# Patient Record
Sex: Female | Born: 1995 | Race: White | Hispanic: No | Marital: Single | Smoking: Never smoker
Health system: Southern US, Community
[De-identification: ages and names within clinical notes are randomized; demographics above are authoritative.]

## PROBLEM LIST (undated history)

## (undated) DIAGNOSIS — IMO0001 Reserved for inherently not codable concepts without codable children: Secondary | ICD-10-CM

## (undated) DIAGNOSIS — K219 Gastro-esophageal reflux disease without esophagitis: Secondary | ICD-10-CM

## (undated) DIAGNOSIS — G44309 Post-traumatic headache, unspecified, not intractable: Secondary | ICD-10-CM

## (undated) DIAGNOSIS — R7303 Prediabetes: Secondary | ICD-10-CM

## (undated) DIAGNOSIS — Z9289 Personal history of other medical treatment: Secondary | ICD-10-CM

## (undated) DIAGNOSIS — S0990XS Unspecified injury of head, sequela: Secondary | ICD-10-CM

## (undated) HISTORY — DX: Personal history of other medical treatment: Z92.89

## (undated) HISTORY — DX: Prediabetes: R73.03

## (undated) HISTORY — PX: CHOLECYSTECTOMY: SHX55

## (undated) HISTORY — DX: Post-traumatic headache, unspecified, not intractable: G44.309

## (undated) HISTORY — DX: Gastro-esophageal reflux disease without esophagitis: K21.9

## (undated) HISTORY — DX: Reserved for inherently not codable concepts without codable children: IMO0001

## (undated) HISTORY — DX: Unspecified injury of head, sequela: S09.90XS

---
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
## 2015-06-06 ENCOUNTER — Encounter: Payer: Self-pay | Admitting: *Deleted

## 2015-06-06 ENCOUNTER — Emergency Department
Admission: EM | Admit: 2015-06-06 | Discharge: 2015-06-06 | Disposition: A | Discharge status: Home / Self Care | Payer: Medicaid Other | Attending: Emergency Medicine | Admitting: Emergency Medicine

## 2015-06-06 DIAGNOSIS — R110 Nausea: Secondary | ICD-10-CM | POA: Insufficient documentation

## 2015-06-06 DIAGNOSIS — Z792 Long term (current) use of antibiotics: Secondary | ICD-10-CM | POA: Diagnosis not present

## 2015-06-06 DIAGNOSIS — R51 Headache: Secondary | ICD-10-CM | POA: Diagnosis present

## 2015-06-06 DIAGNOSIS — R519 Headache, unspecified: Secondary | ICD-10-CM

## 2015-06-06 MED ORDER — BUTALBITAL-APAP-CAFFEINE 50-325-40 MG PO TABS: 1.0000 | ORAL_TABLET | Freq: Four times a day (QID) | ORAL | Status: AC | PRN

## 2015-06-06 MED ORDER — KETOROLAC TROMETHAMINE 30 MG/ML IJ SOLN
30.0000 mg | Freq: Once | INTRAMUSCULAR | Status: AC
  Administered 2015-06-06: 30 mg via INTRAVENOUS
  Filled 2015-06-06: qty 1

## 2015-06-06 MED ORDER — ONDANSETRON 8 MG PO TBDP
8.0000 mg | ORAL_TABLET | Freq: Once | ORAL | Status: AC
  Administered 2015-06-06: 8 mg via ORAL
  Filled 2015-06-06: qty 1

## 2015-06-06 MED ORDER — BUTALBITAL-APAP-CAFFEINE 50-325-40 MG PO TABS
1.0000 | ORAL_TABLET | Freq: Once | ORAL | Status: AC
  Administered 2015-06-06: 1 via ORAL
  Filled 2015-06-06: qty 1

## 2015-06-06 NOTE — ED Notes (Signed)
Pt has a headache.  Taking aleve without relief.  Pt alert and speech clear.  reports nausea earlier today.

## 2015-06-06 NOTE — ED Provider Notes (Signed)
[facility] Emergency Department Provider Note  ____________________________________________  Time seen: Approximately 9:37 PM  I have reviewed the triage vital signs and the nursing notes.   HISTORY  Chief Complaint Headache    HPI Vanessa Booker is a 19 y.o. female presents for evaluation of headache. Patient states the headache started earlier today with nausea. Past medical history significant for migraines. No relief with over-the-counter Aleve today.   Past Medical History  Diagnosis Date  . Asthma   . History of PFTs sept 2013    normal   . Reflux   . Prediabetes   . Headaches due to old head injury     Patient Active Problem List   Diagnosis Date Noted  . Adjustment disorder with mixed anxiety and depressed mood 09/13/2013  . Folliculitis 07/27/2013  . Hidradenitis suppurativa 10/01/2012  . Prediabetes 09/21/2012    No past surgical history on file.  Current Outpatient Rx  Name  Route  Sig  Dispense  Refill  . albuterol (PROVENTIL HFA;VENTOLIN HFA) 108 (90 BASE) MCG/ACT inhaler   Inhalation   Inhale 2 puffs into the lungs every 6 (six) hours as needed. Shortness of Breath   1 Inhaler   5   . butalbital-acetaminophen-caffeine (FIORICET) 50-325-40 MG tablet   Oral   Take 1-2 tablets by mouth every 6 (six) hours as needed for headache.   20 tablet   0   . chlorzoxazone (PARAFON) 500 MG tablet   Oral   Take 1 tablet (500 mg total) by mouth 2 (two) times daily as needed for muscle spasms.   28 tablet   0   . clindamycin (CLEOCIN) 300 MG capsule   Oral   Take 1 capsule (300 mg total) by mouth 3 (three) times daily.   21 capsule   0   . ibuprofen (ADVIL,MOTRIN) 600 MG tablet   Oral   Take 1 tablet (600 mg total) by mouth every 6 (six) hours as needed.   90 tablet   1     Allergies Metformin and related; Cefazolin; and Cefzil  Family History  Problem Relation Age of Onset  . Diabetes Mother   . Hypertension Mother    . Depression Mother   . Kidney disease Mother   . Anxiety disorder Mother   . Cancer Father     passed away from colon cancer  . Cancer Maternal Aunt   . Anxiety disorder Maternal Aunt   . Depression Maternal Aunt   . Diabetes Maternal Grandfather   . Hypertension Maternal Grandfather   . Anxiety disorder Maternal Grandfather   . Depression Maternal Grandfather     Social History Social History  Substance Use Topics  . Smoking status: Never Smoker   . Smokeless tobacco: Never Used  . Alcohol Use: No    Review of Systems Constitutional: No fever/chills Eyes: No visual changes. ENT: No sore throat.  Cardiovascular: Denies chest pain. Respiratory: Denies shortness of breath. Gastrointestinal: No abdominal pain.  Positive nausea, no vomiting.  No diarrhea.  No constipation. Genitourinary: Negative for dysuria. Musculoskeletal: Negative for back pain. Skin: Negative for rash. Neurological: Positive for headaches, negative for focal weakness or numbness.  10-point ROS otherwise negative.  ____________________________________________   PHYSICAL EXAM:  VITAL SIGNS: ED Triage Vitals  Enc Vitals Group     BP 06/06/15 2103 133/79 mmHg     Pulse Rate 06/06/15 2103 74     Resp --      Temp 06/06/15 2103 97.5  F (36.4 C)     Temp Source 06/06/15 2103 Oral     SpO2 06/06/15 2103 97 %     Weight 06/06/15 2103 264 lb (119.75 kg)     Height 06/06/15 2103 5\' 5"  (1.651 m)     Head Cir --      Peak Flow --      Pain Score 06/06/15 2103 5     Pain Loc --      Pain Edu? --      Excl. in GC? --     Constitutional: Alert and oriented. Well appearing and in no acute distress. Eyes: Conjunctivae are normal. PERRL. EOMI. Head: Atraumatic. Nose: No congestion/rhinnorhea. Mouth/Throat: Mucous membranes are moist.  Oropharynx non-erythematous. Neck: No stridor.   Cardiovascular: Normal rate, regular rhythm. Grossly normal heart sounds.  Good peripheral  circulation. Respiratory: Normal respiratory effort.  No retractions. Lungs CTAB. Musculoskeletal: No lower extremity tenderness nor edema.  No joint effusions. Neurologic:  Normal speech and language. No gross focal neurologic deficits are appreciated. No gait instability. Skin:  Skin is warm, dry and intact. No rash noted. Psychiatric: Mood and affect are normal. Speech and behavior are normal.  ____________________________________________   LABS (all labs ordered are listed, but only abnormal results are displayed)  Labs Reviewed - No data to display ____________________________________________   PROCEDURES  Procedure(s) performed: None  Critical Care performed: No  ____________________________________________   INITIAL IMPRESSION / ASSESSMENT AND PLAN / ED COURSE  Pertinent labs & imaging results that were available during my care of the patient were reviewed by me and considered in my medical decision making (see chart for details).  Acute tension/migraine headache. Patient reports much improvement after IV Toradol. Zofran 8 mg sublingual given. She discharged with Rx for Fioricet. Patient follow-up with PCP or return to the ER with any worsening symptomology. ____________________________________________   FINAL CLINICAL IMPRESSION(S) / ED DIAGNOSES  Final diagnoses:  Headache around the eyes      Evangeline Dakinharles M Raiden Yearwood, PA-C 06/06/15 2302  Rockne MenghiniAnne-Caroline Norman, MD 06/06/15 2353

## 2015-06-06 NOTE — Discharge Instructions (Signed)
Migraine Headache  A migraine headache is very bad, throbbing pain on one or both sides of your head. Talk to your doctor about what things may bring on (trigger) your migraine headaches.  HOME CARE  · Only take medicines as told by your doctor.  · Lie down in a dark, quiet room when you have a migraine.  · Keep a journal to find out if certain things bring on migraine headaches. For example, write down:    What you eat and drink.    How much sleep you get.    Any change to your diet or medicines.  · Lessen how much alcohol you drink.  · Quit smoking if you smoke.  · Get enough sleep.  · Lessen any stress in your life.  · Keep lights dim if bright lights bother you or make your migraines worse.  GET HELP RIGHT AWAY IF:   · Your migraine becomes really bad.  · You have a fever.  · You have a stiff neck.  · You have trouble seeing.  · Your muscles are weak, or you lose muscle control.  · You lose your balance or have trouble walking.  · You feel like you will pass out (faint), or you pass out.  · You have really bad symptoms that are different than your first symptoms.  MAKE SURE YOU:   · Understand these instructions.  · Will watch your condition.  · Will get help right away if you are not doing well or get worse.     This information is not intended to replace advice given to you by your health care provider. Make sure you discuss any questions you have with your health care provider.     Document Released: 03/26/2008 Document Revised: 09/09/2011 Document Reviewed: 02/22/2013  Elsevier Interactive Patient Education ©2016 Else[employer] Inc[employer]

## 2015-07-27 ENCOUNTER — Emergency Department
Admission: EM | Admit: 2015-07-27 | Discharge: 2015-07-28 | Disposition: A | Discharge status: Home / Self Care | Payer: PRIVATE HEALTH INSURANCE | Attending: Emergency Medicine | Admitting: Emergency Medicine

## 2015-07-27 ENCOUNTER — Encounter: Payer: Self-pay | Admitting: Urgent Care

## 2015-07-27 DIAGNOSIS — R1011 Right upper quadrant pain: Secondary | ICD-10-CM | POA: Insufficient documentation

## 2015-07-27 DIAGNOSIS — Z792 Long term (current) use of antibiotics: Secondary | ICD-10-CM | POA: Insufficient documentation

## 2015-07-27 DIAGNOSIS — R1013 Epigastric pain: Secondary | ICD-10-CM | POA: Diagnosis present

## 2015-07-27 DIAGNOSIS — Z3202 Encounter for pregnancy test, result negative: Secondary | ICD-10-CM | POA: Insufficient documentation

## 2015-07-27 DIAGNOSIS — R110 Nausea: Secondary | ICD-10-CM | POA: Diagnosis not present

## 2015-07-27 DIAGNOSIS — N939 Abnormal uterine and vaginal bleeding, unspecified: Secondary | ICD-10-CM | POA: Insufficient documentation

## 2015-07-27 LAB — COMPREHENSIVE METABOLIC PANEL
ALT: 36 U/L (ref 14–54)
AST: 26 U/L (ref 15–41)
Albumin: 4 g/dL (ref 3.5–5.0)
Alkaline Phosphatase: 64 U/L (ref 38–126)
Anion gap: 9 (ref 5–15)
BUN: 13 mg/dL (ref 6–20)
CO2: 26 mmol/L (ref 22–32)
CREATININE: 0.6 mg/dL (ref 0.44–1.00)
Calcium: 9 mg/dL (ref 8.9–10.3)
Chloride: 102 mmol/L (ref 101–111)
GFR calc Af Amer: >60 mL/min (ref >60)
GFR calc non Af Amer: >60 mL/min (ref >60)
Glucose, Bld: 86 mg/dL (ref 65–99)
Potassium: 3.5 mmol/L (ref 3.5–5.1)
SODIUM: 137 mmol/L (ref 135–145)
Total Bilirubin: 1.1 mg/dL (ref 0.3–1.2)
Total Protein: 7.9 g/dL (ref 6.5–8.1)

## 2015-07-27 LAB — URINALYSIS COMPLETE WITH MICROSCOPIC (ARMC ONLY)
BACTERIA UA: NONE SEEN
Bilirubin Urine: NEGATIVE
Glucose, UA: NEGATIVE mg/dL
Ketones, ur: NEGATIVE mg/dL
LEUKOCYTES UA: NEGATIVE
Nitrite: NEGATIVE
PH: 5 (ref 5.0–8.0)
PROTEIN: NEGATIVE mg/dL
Specific Gravity, Urine: 1.021 (ref 1.005–1.030)

## 2015-07-27 LAB — CBC
HCT: 40.7 % (ref 35.0–47.0)
Hemoglobin: 13.9 g/dL (ref 12.0–16.0)
MCH: 29 pg (ref 26.0–34.0)
MCHC: 34.1 g/dL (ref 32.0–36.0)
MCV: 85 fL (ref 80.0–100.0)
PLATELETS: 231 10*3/uL (ref 150–440)
RBC: 4.78 MIL/uL (ref 3.80–5.20)
RDW: 13 % (ref 11.5–14.5)
WBC: 5.1 10*3/uL (ref 3.6–11.0)

## 2015-07-27 LAB — POCT PREGNANCY, URINE: PREG TEST UR: NEGATIVE

## 2015-07-27 LAB — TROPONIN I

## 2015-07-27 LAB — LIPASE, BLOOD: Lipase: 31 U/L (ref 11–51)

## 2015-07-27 NOTE — ED Notes (Addendum)
Patient presents with c/o epigastric pain since yesterday. (+) nausea. Pain is through and through to back and worse after eating. Also reports that she has not has a peroiod in over a year until 2 days ago. States, "I am not passing red blood. I am passing black blood."

## 2015-07-28 ENCOUNTER — Emergency Department: Payer: PRIVATE HEALTH INSURANCE

## 2015-07-28 DIAGNOSIS — R1013 Epigastric pain: Secondary | ICD-10-CM | POA: Diagnosis not present

## 2015-07-28 MED ORDER — SUCRALFATE 1 G PO TABS: 1.0000 g | ORAL_TABLET | Freq: Four times a day (QID) | ORAL | Status: DC | PRN

## 2015-07-28 NOTE — ED Provider Notes (Signed)
[facility] Emergency Department Provider Note  ____________________________________________  Time seen: Approximately 12:13 AM  I have reviewed the triage vital signs and the nursing notes.   HISTORY  Chief Complaint Abdominal Pain    HPI Vanessa Booker is a 20 y.o. female with no significant PMHthen acid reflux and mild asthma who presents with approximately 2 days of constant epigastric and right upper quadrant pain.  She states that she thinks it started after she ate and has been constant since then, waxing and waning in severity from mild to severe.  It feels like a sharp and stabbing sensation that is radiating through to her back.  It is accompanied with nausea but no vomiting and no diarrhea.  There is no lower abdominal pain.  She denies fever/chills, chest pain, shortness of breath, dysuria.  Nothing makes it better and eating makes it worse.  She also reports that she is started "passing black blood" from her vagina over the last couple of days which is unusual for her because she has not had a period of more than a year.  However, she has no pelvic or vaginal pain and tells me she is not particular concerned about this.   Past Medical History  Diagnosis Date  . Asthma   . History of PFTs sept 2013    normal   . Reflux   . Prediabetes   . Headaches due to old head injury     Patient Active Problem List   Diagnosis Date Noted  . Adjustment disorder with mixed anxiety and depressed mood 09/13/2013  . Folliculitis 07/27/2013  . Hidradenitis suppurativa 10/01/2012  . Prediabetes 09/21/2012    History reviewed. No pertinent past surgical history.  Current Outpatient Rx  Name  Route  Sig  Dispense  Refill  . albuterol (PROVENTIL HFA;VENTOLIN HFA) 108 (90 BASE) MCG/ACT inhaler   Inhalation   Inhale 2 puffs into the lungs every 6 (six) hours as needed. Shortness of Breath   1 Inhaler   5   . butalbital-acetaminophen-caffeine (FIORICET)  50-325-40 MG tablet   Oral   Take 1-2 tablets by mouth every 6 (six) hours as needed for headache.   20 tablet   0   . chlorzoxazone (PARAFON) 500 MG tablet   Oral   Take 1 tablet (500 mg total) by mouth 2 (two) times daily as needed for muscle spasms.   28 tablet   0   . clindamycin (CLEOCIN) 300 MG capsule   Oral   Take 1 capsule (300 mg total) by mouth 3 (three) times daily.   21 capsule   0   . ibuprofen (ADVIL,MOTRIN) 600 MG tablet   Oral   Take 1 tablet (600 mg total) by mouth every 6 (six) hours as needed.   90 tablet   1   . sucralfate (CARAFATE) 1 g tablet   Oral   Take 1 tablet (1 g total) by mouth 4 (four) times daily as needed (for abdominal discomfort, nausea, and/or vomiting).   30 tablet   1     Allergies Metformin and related; Cefazolin; and Cefzil  Family History  Problem Relation Age of Onset  . Diabetes Mother   . Hypertension Mother   . Depression Mother   . Kidney disease Mother   . Anxiety disorder Mother   . Cancer Father     passed away from colon cancer  . Cancer Maternal Aunt   . Anxiety disorder Maternal Aunt   . Depression  Maternal Aunt   . Diabetes Maternal Grandfather   . Hypertension Maternal Grandfather   . Anxiety disorder Maternal Grandfather   . Depression Maternal Grandfather     Social History Social History  Substance Use Topics  . Smoking status: Never Smoker   . Smokeless tobacco: Never Used  . Alcohol Use: No    Review of Systems Constitutional: No fever/chills Eyes: No visual changes. ENT: No sore throat. Cardiovascular: Denies chest pain. Respiratory: Denies shortness of breath. Gastrointestinal: Epigastric and right upper quadrant pain described as sharp and stabbing accompanied with nausea Genitourinary: Negative for dysuria. Musculoskeletal: Negative for back pain. Skin: Negative for rash. Neurological: Negative for headaches, focal weakness or numbness.  10-point ROS otherwise  negative.  ____________________________________________   PHYSICAL EXAM:  VITAL SIGNS: ED Triage Vitals  Enc Vitals Group     BP 07/27/15 2112 119/76 mmHg     Pulse Rate 07/27/15 2112 92     Resp 07/27/15 2112 16     Temp 07/27/15 2112 98.6 F (37 C)     Temp Source 07/27/15 2112 Oral     SpO2 07/27/15 2112 99 %     Weight 07/27/15 2112 265 lb (120.203 kg)     Height 07/27/15 2112  (1.676 m)     Head Cir --      Peak Flow --      Pain Score 07/27/15 2113 6     Pain Loc --      Pain Edu? --      Excl. in GC? --     Constitutional: Alert and oriented. Well appearing and in no acute distress. Eyes: Conjunctivae are normal. PERRL. EOMI. Head: Atraumatic. Nose: No congestion/rhinnorhea. Mouth/Throat: Mucous membranes are moist.  Oropharynx non-erythematous. Neck: No stridor.   Cardiovascular: Normal rate, regular rhythm. Grossly normal heart sounds.  Good peripheral circulation. Respiratory: Normal respiratory effort.  No retractions. Lungs CTAB. Gastrointestinal: Tender to palpation of the epigastrium and right upper quadrant with a mild positive Murphy's sign.  No lower abdominal tenderness including no tenderness at McBurney's point.  Soft, no rebound or guarding. Genitourinary: Deferred at patient's request Musculoskeletal: No lower extremity tenderness nor edema.  No joint effusions. Neurologic:  Normal speech and language. No gross focal neurologic deficits are appreciated.  Skin:  Skin is warm, dry and intact. No rash noted. Psychiatric: Mood and affect are normal. Speech and behavior are normal.  ____________________________________________   LABS (all labs ordered are listed, but only abnormal results are displayed)  Labs Reviewed  URINALYSIS COMPLETEWITH MICROSCOPIC ([facility] ONLY) - Abnormal; Notable for the following:    Color, Urine YELLOW (*)    APPearance CLEAR (*)    Hgb urine dipstick 2+ (*)    Squamous Epithelial / LPF 0-5 (*)    All other  components within normal limits  LIPASE, BLOOD  COMPREHENSIVE METABOLIC PANEL  CBC  TROPONIN I  POC URINE PREG, ED  POCT PREGNANCY, URINE   ____________________________________________  EKG  None ____________________________________________  RADIOLOGY   US Abdomen Limited Ruq  07/28/2015  CLINICAL DATA:  Acute onset of right upper quadrant and epigastric abdominal pain. Initial encounter. EXAM: US ABDOMEN LIMITED - RIGHT UPPER QUADRANT COMPARISON:  Abdominal ultrasound performed 04/26/2008 FINDINGS: Gallbladder: No gallstones or wall thickening visualized. No sonographic Murphy sign noted by sonographer. Common bile duct: Diameter: 0.3 cm, within normal limits in caliber. Liver: No focal lesion identified. Diffusely increased parenchymal echogenicity and coarsened echotexture, compatible with fatty infiltration. IMPRESSION: 1. No acute  abnormality seen at the right upper quadrant. 2. Diffuse fatty infiltration within the liver. Electronically Signed   By: Roanna Raider M.D.   On: 07/28/2015 02:05    ____________________________________________   PROCEDURES  Procedure(s) performed: None  Critical Care performed: No ____________________________________________   INITIAL IMPRESSION / ASSESSMENT AND PLAN / ED COURSE  Pertinent labs & imaging results that were available during my care of the patient were reviewed by me and considered in my medical decision making (see chart for details).  The patient's symptoms are mild at this time and she declines any pain medicine or antiemetics.  We discussed the report of abnormal vaginal bleeding and she declines a pelvic exam at this time and would rather follow up with her primary care doctor which I believe is appropriate.  Her labs are unremarkable and her vital signs are normal.  I am obtaining a right upper quadrant ultrasound to evaluate for possible biliary colic.  ----------------------------------------- 2:22 AM on  07/28/2015 -----------------------------------------  Ultrasound unremarkable.  Patient's symptoms most likely the result of acid reflux/gastritis although she has had no vomiting.  She is asymptomatic now and was sleeping comfortably when I went back in the room to give her an update.  I gave my usual and customary return precautions.    ____________________________________________  FINAL CLINICAL IMPRESSION(S) / ED DIAGNOSES  Final diagnoses:  Epigastric pain      NEW MEDICATIONS STARTED DURING THIS VISIT:  New Prescriptions   SUCRALFATE (CARAFATE) 1 G TABLET    Take 1 tablet (1 g total) by mouth 4 (four) times daily as needed (for abdominal discomfort, nausea, and/or vomiting).     Loleta Rose, MD 07/28/15 [phone]

## 2015-07-28 NOTE — ED Notes (Signed)
MD at bedside. 

## 2015-07-28 NOTE — Discharge Instructions (Signed)
You have been seen in the Emergency Department (ED) for abdominal pain.  Your evaluation did not identify a clear cause of your symptoms but was generally reassuring. ° °Please follow up as instructed above regarding today’s emergent visit and the symptoms that are bothering you. ° °Return to the ED if your abdominal pain worsens or fails to improve, you develop bloody vomiting, bloody diarrhea, you are unable to tolerate fluids due to vomiting, fever greater than 101, or other symptoms that concern you. ° ° °Abdominal Pain, Adult °Many things can cause abdominal pain. Usually, abdominal pain is not caused by a disease and will improve without treatment. It can often be observed and treated at home. Your health care provider will do a physical exam and possibly order blood tests and X-rays to help determine the seriousness of your pain. However, in many cases, more time must pass before a clear cause of the pain can be found. Before that point, your health care provider may not know if you need more testing or further treatment. °HOME CARE INSTRUCTIONS °Monitor your abdominal pain for any changes. The following actions may help to alleviate any discomfort you are experiencing: °· Only take over-the-counter or prescription medicines as directed by your health care provider. °· Do not take laxatives unless directed to do so by your health care provider. °· Try a clear liquid diet (broth, tea, or water) as directed by your health care provider. Slowly move to a bland diet as tolerated. °SEEK MEDICAL CARE IF: °· You have unexplained abdominal pain. °· You have abdominal pain associated with nausea or diarrhea. °· You have pain when you urinate or have a bowel movement. °· You experience abdominal pain that wakes you in the night. °· You have abdominal pain that is worsened or improved by eating food. °· You have abdominal pain that is worsened with eating fatty foods. °· You have a fever. °SEEK IMMEDIATE MEDICAL CARE  IF: °· Your pain does not go away within 2 hours. °· You keep throwing up (vomiting). °· Your pain is felt only in portions of the abdomen, such as the right side or the left lower portion of the abdomen. °· You pass bloody or black tarry stools. °MAKE SURE YOU: °· Understand these instructions. °· Will watch your condition. °· Will get help right away if you are not doing well or get worse. °  °This information is not intended to replace advice given to you by your health care provider. Make sure you discuss any questions you have with your health care provider. °  °Document Released: 03/27/2005 Document Revised: 03/08/2015 Document Reviewed: 02/24/2013 °Elsevier Interactive Patient Education ©2016 [employer]vier Inc. ° °

## 2015-07-28 NOTE — ED Notes (Signed)
Pt alert and oriented X4, active, cooperative, pt in NAD. RR even and unlabored, color WNL.  Pt informed to return if any life threatening symptoms occur.   

## 2015-09-21 ENCOUNTER — Encounter: Payer: Self-pay | Admitting: Emergency Medicine

## 2015-09-21 ENCOUNTER — Emergency Department
Admission: EM | Admit: 2015-09-21 | Discharge: 2015-09-21 | Disposition: A | Discharge status: Home / Self Care | Payer: PRIVATE HEALTH INSURANCE | Attending: Emergency Medicine | Admitting: Emergency Medicine

## 2015-09-21 DIAGNOSIS — R05 Cough: Secondary | ICD-10-CM | POA: Diagnosis present

## 2015-09-21 DIAGNOSIS — J45909 Unspecified asthma, uncomplicated: Secondary | ICD-10-CM | POA: Insufficient documentation

## 2015-09-21 DIAGNOSIS — F4323 Adjustment disorder with mixed anxiety and depressed mood: Secondary | ICD-10-CM | POA: Diagnosis not present

## 2015-09-21 DIAGNOSIS — J40 Bronchitis, not specified as acute or chronic: Secondary | ICD-10-CM

## 2015-09-21 DIAGNOSIS — J209 Acute bronchitis, unspecified: Secondary | ICD-10-CM | POA: Insufficient documentation

## 2015-09-21 LAB — RAPID INFLUENZA A&B ANTIGENS: Influenza B (ARMC): NEGATIVE

## 2015-09-21 LAB — RAPID INFLUENZA A&B ANTIGENS (ARMC ONLY): INFLUENZA A (ARMC): NEGATIVE

## 2015-09-21 MED ORDER — PREDNISONE 10 MG PO TABS: 10.0000 mg | ORAL_TABLET | ORAL | Status: DC

## 2015-09-21 MED ORDER — AZITHROMYCIN 250 MG PO TABS: ORAL_TABLET | ORAL | Status: DC

## 2015-09-21 MED ORDER — PSEUDOEPH-BROMPHEN-DM 30-2-10 MG/5ML PO SYRP: 10.0000 mL | ORAL_SOLUTION | Freq: Four times a day (QID) | ORAL | Status: DC | PRN

## 2015-09-21 MED ORDER — ALBUTEROL SULFATE HFA 108 (90 BASE) MCG/ACT IN AERS: 2.0000 | INHALATION_SPRAY | RESPIRATORY_TRACT | Status: DC | PRN

## 2015-09-21 MED ORDER — IBUPROFEN 800 MG PO TABS
ORAL_TABLET | ORAL | Status: AC
  Filled 2015-09-21: qty 1

## 2015-09-21 MED ORDER — IBUPROFEN 800 MG PO TABS
800.0000 mg | ORAL_TABLET | Freq: Once | ORAL | Status: AC
  Administered 2015-09-21: 800 mg via ORAL

## 2015-09-21 NOTE — ED Notes (Signed)
Pt discharged to home.  Friend driving.  Discharge instructions reviewed.  Verbalized understanding.  No questions or concerns at this time.  Teach back verified.  Pt in NAD.  No items left in ED.   

## 2015-09-21 NOTE — Discharge Instructions (Signed)

## 2015-09-21 NOTE — ED Notes (Signed)
States productive cough for green phlem, headache, chills x 1 day.  Also c/o fever 101.2 this evening.  Has not medicated for fever.

## 2015-09-21 NOTE — ED Provider Notes (Signed)
[facility] Emergency Department Provider Note  ____________________________________________  Time seen: Approximately 9:42 PM  I have reviewed the triage vital signs and the nursing notes.   HISTORY  Chief Complaint Fever and Cough    HPI Vanessa Booker is a 20 y.o. female who presents emergency department for several day history of cough. Patient states that today she also developed a fever of 101.76F. Patient states that she is having some audible wheezing. She endorses some chest tightness with coughing. She states the cough is productive in nature. No difficulty breathing. She does have an intermittent headache. She denies any vision changes, neck pain, chest pain, shortness of breath, abdominal pain, nausea or vomiting.   Past Medical History  Diagnosis Date  . Asthma   . History of PFTs sept 2013    normal   . Reflux   . Prediabetes   . Headaches due to old head injury     Patient Active Problem List   Diagnosis Date Noted  . Adjustment disorder with mixed anxiety and depressed mood 09/13/2013  . Folliculitis 07/27/2013  . Hidradenitis suppurativa 10/01/2012  . Prediabetes 09/21/2012    History reviewed. No pertinent past surgical history.  Current Outpatient Rx  Name  Route  Sig  Dispense  Refill  . albuterol (PROVENTIL HFA;VENTOLIN HFA) 108 (90 Base) MCG/ACT inhaler   Inhalation   Inhale 2 puffs into the lungs every 4 (four) hours as needed for wheezing or shortness of breath.   1 Inhaler   0   . azithromycin (ZITHROMAX Z-PAK) 250 MG tablet      Take 2 tablets (500 mg) on  Day 1,  followed by 1 tablet (250 mg) once daily on Days 2 through 5.   6 each   0   . brompheniramine-pseudoephedrine-DM 30-2-10 MG/5ML syrup   Oral   Take 10 mLs by mouth 4 (four) times daily as needed.   200 mL   0   . butalbital-acetaminophen-caffeine (FIORICET) 50-325-40 MG tablet   Oral   Take 1-2 tablets by mouth every 6 (six) hours as needed for  headache.   20 tablet   0   . chlorzoxazone (PARAFON) 500 MG tablet   Oral   Take 1 tablet (500 mg total) by mouth 2 (two) times daily as needed for muscle spasms.   28 tablet   0   . clindamycin (CLEOCIN) 300 MG capsule   Oral   Take 1 capsule (300 mg total) by mouth 3 (three) times daily.   21 capsule   0   . ibuprofen (ADVIL,MOTRIN) 600 MG tablet   Oral   Take 1 tablet (600 mg total) by mouth every 6 (six) hours as needed.   90 tablet   1   . predniSONE (DELTASONE) 10 MG tablet   Oral   Take 1 tablet (10 mg total) by mouth as directed.   21 tablet   0     Take on a daily basis of 6, 5, 4, 3, 2, 1   . sucralfate (CARAFATE) 1 g tablet   Oral   Take 1 tablet (1 g total) by mouth 4 (four) times daily as needed (for abdominal discomfort, nausea, and/or vomiting).   30 tablet   1     Allergies Metformin and related; Cefazolin; and Cefzil  Family History  Problem Relation Age of Onset  . Diabetes Mother   . Hypertension Mother   . Depression Mother   . Kidney disease Mother   .  Anxiety disorder Mother   . Cancer Father     passed away from colon cancer  . Cancer Maternal Aunt   . Anxiety disorder Maternal Aunt   . Depression Maternal Aunt   . Diabetes Maternal Grandfather   . Hypertension Maternal Grandfather   . Anxiety disorder Maternal Grandfather   . Depression Maternal Grandfather     Social History Social History  Substance Use Topics  . Smoking status: Never Smoker   . Smokeless tobacco: Never Used  . Alcohol Use: No     Review of Systems  Constitutional: Positive fever/chills ENT: No sore throat. No nasal congestion. Cardiovascular: no chest pain. Respiratory: Positive for productive cough. No SOB. Skin: Negative for rash. Neurological: Negative for headaches, focal weakness or numbness. 10-point ROS otherwise negative.  ____________________________________________   PHYSICAL EXAM:  VITAL SIGNS: ED Triage Vitals  Enc Vitals Group      BP 09/21/15 1954 111/66 mmHg     Pulse Rate 09/21/15 1954 117     Resp 09/21/15 1954 18     Temp 09/21/15 1954 99.1 F (37.3 C)     Temp Source 09/21/15 1954 Oral     SpO2 09/21/15 1954 96 %     Weight 09/21/15 1954 264 lb (119.75 kg)     Height 09/21/15 1954  (1.676 m)     Head Cir --      Peak Flow --      Pain Score 09/21/15 1955 7     Pain Loc --      Pain Edu? --      Excl. in GC? --      Constitutional: Alert and oriented. Well appearing and in no acute distress. Eyes: Conjunctivae are normal. PERRL. EOMI. Head: Atraumatic. ENT:      Ears: EACs and TMs are unremarkable bilaterally.      Nose: No congestion/rhinnorhea.      Mouth/Throat: Mucous membranes are moist.  Neck: No stridor.   Hematological/Lymphatic/Immunilogical: No cervical lymphadenopathy. Cardiovascular: Normal rate, regular rhythm. Normal S1 and S2.  Good peripheral circulation. Respiratory: Normal respiratory effort without tachypnea or retractions. Lungs with scattered expiratory wheezing. No rales or rhonchi. Good air entry into the bases. Neurologic:  Normal speech and language. No gross focal neurologic deficits are appreciated.  Skin:  Skin is warm, dry and intact. No rash noted. Psychiatric: Mood and affect are normal. Speech and behavior are normal. Patient exhibits appropriate insight and judgement.   ____________________________________________   LABS (all labs ordered are listed, but only abnormal results are displayed)  Labs Reviewed  RAPID INFLUENZA A&B ANTIGENS ([facility] ONLY)   ____________________________________________  EKG   ____________________________________________  RADIOLOGY   No results found.  ____________________________________________    PROCEDURES  Procedure(s) performed:       Medications  ibuprofen (ADVIL,MOTRIN) 800 MG tablet (not administered)  ibuprofen (ADVIL,MOTRIN) tablet 800 mg (800 mg Oral Given 09/21/15 2138)      ____________________________________________   INITIAL IMPRESSION / ASSESSMENT AND PLAN / ED COURSE  Pertinent labs & imaging results that were available during my care of the patient were reviewed by me and considered in my medical decision making (see chart for details).  Patient's diagnosis is consistent with acute bronchitis. Patient's exam is reassuring. She was negative for the fluid per the emergency department.. Patient will be discharged home with prescriptions for albuterol, prednisone, azithromycin, and Bromfed cough syrup. Patient is to follow up with primary care provider if symptoms persist past this treatment course. Patient  is given ED precautions to return to the ED for any worsening or new symptoms.     ____________________________________________  FINAL CLINICAL IMPRESSION(S) / ED DIAGNOSES  Final diagnoses:  Bronchitis      NEW MEDICATIONS STARTED DURING THIS VISIT:  New Prescriptions   ALBUTEROL (PROVENTIL HFA;VENTOLIN HFA) 108 (90 BASE) MCG/ACT INHALER    Inhale 2 puffs into the lungs every 4 (four) hours as needed for wheezing or shortness of breath.   AZITHROMYCIN (ZITHROMAX Z-PAK) 250 MG TABLET    Take 2 tablets (500 mg) on  Day 1,  followed by 1 tablet (250 mg) once daily on Days 2 through 5.   BROMPHENIRAMINE-PSEUDOEPHEDRINE-DM 30-2-10 MG/5ML SYRUP    Take 10 mLs by mouth 4 (four) times daily as needed.   PREDNISONE (DELTASONE) 10 MG TABLET    Take 1 tablet (10 mg total) by mouth as directed.        This chart was dictated using voice recognition software/Dragon. Despite best efforts to proofread, errors can occur which can change the meaning. Any change was purely unintentional.    Racheal Patches, PA-C 09/21/15 2153  Jene Every, MD 09/21/15 2245

## 2015-11-01 ENCOUNTER — Encounter: Payer: Self-pay | Admitting: Family Medicine

## 2015-11-01 ENCOUNTER — Ambulatory Visit (INDEPENDENT_AMBULATORY_CARE_PROVIDER_SITE_OTHER): Payer: PRIVATE HEALTH INSURANCE | Admitting: Family Medicine

## 2015-11-01 VITALS — BP 110/70 | Ht 65.0 in | Wt 279.1 lb

## 2015-11-01 DIAGNOSIS — G5603 Carpal tunnel syndrome, bilateral upper limbs: Secondary | ICD-10-CM

## 2015-11-01 NOTE — Addendum Note (Signed)
Addended by: Margaretha SheffieldBROWN, Shree Espey S on: 11/01/2015 01:43 PM   Modules accepted: Orders

## 2015-11-01 NOTE — Progress Notes (Signed)
   Subjective:    Patient ID: Vanessa Booker, female    DOB: 12/13/1995, 20 y.o.   MRN: [id number]  Wrist Pain  The pain is present in the left hand and right hand. This is a new problem. The current episode started more than 1 month ago. The problem occurs intermittently. The problem has been unchanged. The quality of the pain is described as aching. The pain is moderate. Associated symptoms include numbness and tingling. The symptoms are aggravated by activity. She has tried NSAIDS for the symptoms. The treatment provided no relief.   Patient has no other concerns at this time.  Patient relates that goes asleep on her in a frequent basis when she is doing activities or even at nighttime she'll wake up in her hands or go to sleep on her.  Review of Systems  Neurological: Positive for tingling and numbness.       Objective:   Physical Exam Positive Tinel and Phalen's No weakness in the hands noted forearms normal lungs clear heart regular       Assessment & Plan:  Bilateral carpal tunnel syndrome recommend nerve conduction studies await the results of this. In addition to this patient will need orthopedic referral but first nerve conduction study she is to wear braces at nighttime

## 2015-11-01 NOTE — Progress Notes (Signed)
Referral ordered in EPIC. 

## 2015-11-02 ENCOUNTER — Encounter: Payer: Self-pay | Admitting: Family Medicine

## 2015-11-29 ENCOUNTER — Emergency Department: Payer: PRIVATE HEALTH INSURANCE

## 2015-11-29 ENCOUNTER — Encounter: Payer: Self-pay | Admitting: Medical Oncology

## 2015-11-29 ENCOUNTER — Emergency Department
Admission: EM | Admit: 2015-11-29 | Discharge: 2015-11-29 | Disposition: A | Discharge status: Home / Self Care | Payer: PRIVATE HEALTH INSURANCE | Attending: Emergency Medicine | Admitting: Emergency Medicine

## 2015-11-29 DIAGNOSIS — R1031 Right lower quadrant pain: Secondary | ICD-10-CM | POA: Diagnosis present

## 2015-11-29 DIAGNOSIS — N939 Abnormal uterine and vaginal bleeding, unspecified: Secondary | ICD-10-CM | POA: Diagnosis not present

## 2015-11-29 DIAGNOSIS — J45909 Unspecified asthma, uncomplicated: Secondary | ICD-10-CM | POA: Diagnosis not present

## 2015-11-29 DIAGNOSIS — R1030 Lower abdominal pain, unspecified: Secondary | ICD-10-CM

## 2015-11-29 LAB — COMPREHENSIVE METABOLIC PANEL
ALT: 22 U/L (ref 14–54)
AST: 20 U/L (ref 15–41)
Albumin: 3.9 g/dL (ref 3.5–5.0)
Alkaline Phosphatase: 49 U/L (ref 38–126)
Anion gap: 7 (ref 5–15)
BUN: 15 mg/dL (ref 6–20)
CHLORIDE: 106 mmol/L (ref 101–111)
CO2: 25 mmol/L (ref 22–32)
CREATININE: 0.54 mg/dL (ref 0.44–1.00)
Calcium: 9.4 mg/dL (ref 8.9–10.3)
GFR calc Af Amer: >60 mL/min (ref >60)
GLUCOSE: 104 mg/dL — AB (ref 65–99)
Potassium: 3.8 mmol/L (ref 3.5–5.1)
SODIUM: 138 mmol/L (ref 135–145)
Total Bilirubin: 0.6 mg/dL (ref 0.3–1.2)
Total Protein: 7.5 g/dL (ref 6.5–8.1)

## 2015-11-29 LAB — CBC WITH DIFFERENTIAL/PLATELET
BASOS ABS: 0 10*3/uL (ref 0–0.1)
Basophils Relative: 1 %
EOS ABS: 0.2 10*3/uL (ref 0–0.7)
Eosinophils Relative: 3 %
HCT: 38.5 % (ref 35.0–47.0)
Hemoglobin: 13 g/dL (ref 12.0–16.0)
LYMPHS ABS: 1.7 10*3/uL (ref 1.0–3.6)
Lymphocytes Relative: 28 %
MCH: 29.1 pg (ref 26.0–34.0)
MCHC: 33.8 g/dL (ref 32.0–36.0)
MCV: 86.2 fL (ref 80.0–100.0)
MONO ABS: 0.5 10*3/uL (ref 0.2–0.9)
MONOS PCT: 8 %
NEUTROS ABS: 3.6 10*3/uL (ref 1.4–6.5)
NEUTROS PCT: 60 %
PLATELETS: 228 10*3/uL (ref 150–440)
RBC: 4.47 MIL/uL (ref 3.80–5.20)
RDW: 13.4 % (ref 11.5–14.5)
WBC: 6.1 10*3/uL (ref 3.6–11.0)

## 2015-11-29 LAB — URINALYSIS COMPLETE WITH MICROSCOPIC (ARMC ONLY)
BILIRUBIN URINE: NEGATIVE
Glucose, UA: NEGATIVE mg/dL
KETONES UR: NEGATIVE mg/dL
Leukocytes, UA: NEGATIVE
NITRITE: NEGATIVE
PH: 5 (ref 5.0–8.0)
PROTEIN: 30 mg/dL — AB
SPECIFIC GRAVITY, URINE: 1.032 — AB (ref 1.005–1.030)

## 2015-11-29 LAB — LIPASE, BLOOD: Lipase: 32 U/L (ref 11–51)

## 2015-11-29 LAB — POCT PREGNANCY, URINE: Preg Test, Ur: NEGATIVE

## 2015-11-29 MED ORDER — MEDROXYPROGESTERONE ACETATE 10 MG PO TABS: 10.0000 mg | ORAL_TABLET | Freq: Every day | ORAL | Status: DC

## 2015-11-29 MED ORDER — IBUPROFEN 800 MG PO TABS: 800.0000 mg | ORAL_TABLET | Freq: Three times a day (TID) | ORAL | Status: DC | PRN

## 2015-11-29 MED ORDER — KETOROLAC TROMETHAMINE 30 MG/ML IJ SOLN
30.0000 mg | Freq: Once | INTRAMUSCULAR | Status: AC
  Administered 2015-11-29: 30 mg via INTRAVENOUS

## 2015-11-29 NOTE — ED Notes (Signed)
Pt reports RLQ abd pain that began 2 days ago with nausea. Denies vomiting/diarrhea.

## 2015-11-29 NOTE — ED Provider Notes (Signed)
[facility] Emergency Department Provider Note        Time seen: ----------------------------------------- 9:14 AM on 11/29/2015 -----------------------------------------    I have reviewed the triage vital signs and the nursing notes.   HISTORY  Chief Complaint Abdominal Pain    HPI Vanessa Booker is a 20 y.o. female who presents to ER with right lower quadrant abdominal pain that started 2 days ago with associated nausea. She denies any fevers or chills, denies vomiting or diarrhea. Patient states she missed her menstrual cycle 2 months ago, then bled for a month and now she is bleeding again less than 2 weeks after her last bleeding episode. She denies other complaints this time.   Past Medical History  Diagnosis Date  . Asthma   . History of PFTs sept 2013    normal   . Reflux   . Prediabetes   . Headaches due to old head injury     Patient Active Problem List   Diagnosis Date Noted  . Adjustment disorder with mixed anxiety and depressed mood 09/13/2013  . Folliculitis 07/27/2013  . Hidradenitis suppurativa 10/01/2012  . Prediabetes 09/21/2012    History reviewed. No pertinent past surgical history.  Allergies Metformin and related; Cefazolin; and Cefzil  Social History Social History  Substance Use Topics  . Smoking status: Never Smoker   . Smokeless tobacco: Never Used  . Alcohol Use: No    Review of Systems Constitutional: Negative for fever. Eyes: Negative for visual changes. ENT: Negative for sore throat. Cardiovascular: Negative for chest pain. Respiratory: Negative for shortness of breath. Gastrointestinal: Positive for abdominal pain Genitourinary: Negative for dysuria. Musculoskeletal: Negative for back pain. Skin: Negative for rash. Neurological: Negative for headaches, focal weakness or numbness.  10-point ROS otherwise negative.  ____________________________________________   PHYSICAL EXAM:  VITAL  SIGNS: ED Triage Vitals  Enc Vitals Group     BP 11/29/15 0849 135/75 mmHg     Pulse Rate 11/29/15 0849 79     Resp 11/29/15 0849 18     Temp 11/29/15 0849 98 F (36.7 C)     Temp Source 11/29/15 0849 Oral     SpO2 11/29/15 0849 98 %     Weight 11/29/15 0849 264 lb (119.75 kg)     Height 11/29/15 0849  (1.676 m)     Head Cir --      Peak Flow --      Pain Score 11/29/15 0850 5     Pain Loc --      Pain Edu? --      Excl. in GC? --     Constitutional: Alert and oriented. Well appearing and in no distress. Eyes: Conjunctivae are normal. PERRL. Normal extraocular movements. ENT   Head: Normocephalic and atraumatic.   Nose: No congestion/rhinnorhea.   Mouth/Throat: Mucous membranes are moist.   Neck: No stridor. Cardiovascular: Normal rate, regular rhythm. No murmurs, rubs, or gallops. Respiratory: Normal respiratory effort without tachypnea nor retractions. Breath sounds are clear and equal bilaterally. No wheezes/rales/rhonchi. Gastrointestinal: Soft and nontender. Normal bowel sounds Musculoskeletal: Nontender with normal range of motion in all extremities. No lower extremity tenderness nor edema. Neurologic:  Normal speech and language. No gross focal neurologic deficits are appreciated.  Skin:  Skin is warm, dry and intact. No rash noted. Psychiatric: Mood and affect are normal. Speech and behavior are normal.  ____________________________________________  ED COURSE:  Pertinent labs & imaging results that were available during my care of the patient  were reviewed by me and considered in my medical decision making (see chart for details). Patient is in no acute distress, we will check basic labs and reevaluate. ____________________________________________    LABS (pertinent positives/negatives)  Labs Reviewed  URINALYSIS COMPLETEWITH MICROSCOPIC ([facility] ONLY) - Abnormal; Notable for the following:    Color, Urine YELLOW (*)    APPearance HAZY (*)     Specific Gravity, Urine 1.032 (*)    Hgb urine dipstick 3+ (*)    Protein, ur 30 (*)    Bacteria, UA FEW (*)    Squamous Epithelial / LPF 0-5 (*)    All other components within normal limits  COMPREHENSIVE METABOLIC PANEL - Abnormal; Notable for the following:    Glucose, Bld 104 (*)    All other components within normal limits  CBC WITH DIFFERENTIAL/PLATELET  LIPASE, BLOOD  POCT PREGNANCY, URINE  POC URINE PREG, ED    RADIOLOGY Images were viewed by me  Abdomen 2 view IMPRESSION: No findings to correspond with the patient's given clinical symptomatology are noted.  ____________________________________________  FINAL ASSESSMENT AND PLAN  Abdominal pain  Plan: Patient with labs and imaging as dictated above. No clear etiology for right flank pain. Her abdomen is benign. She'll be discharged with Motrin and advised to follow-up in one day with her doctor.   Emily FilbertWilliams, Jonathan E, MD   Note: This dictation was prepared with Dragon dictation. Any transcriptional errors that result from this process are unintentional   Emily FilbertJonathan E Williams, MD 11/29/15 1201

## 2015-11-29 NOTE — Discharge Instructions (Signed)
Abdominal Pain, Adult °Many things can cause abdominal pain. Usually, abdominal pain is not caused by a disease and will improve without treatment. It can often be observed and treated at home. Your health care provider will do a physical exam and possibly order blood tests and X-rays to help determine the seriousness of your pain. However, in many cases, more time must pass before a clear cause of the pain can be found. Before that point, your health care provider may not know if you need more testing or further treatment. °HOME CARE INSTRUCTIONS °Monitor your abdominal pain for any changes. The following actions may help to alleviate any discomfort you are experiencing: °· Only take over-the-counter or prescription medicines as directed by your health care provider. °· Do not take laxatives unless directed to do so by your health care provider. °· Try a clear liquid diet (broth, tea, or water) as directed by your health care provider. Slowly move to a bland diet as tolerated. °SEEK MEDICAL CARE IF: °· You have unexplained abdominal pain. °· You have abdominal pain associated with nausea or diarrhea. °· You have pain when you urinate or have a bowel movement. °· You experience abdominal pain that wakes you in the night. °· You have abdominal pain that is worsened or improved by eating food. °· You have abdominal pain that is worsened with eating fatty foods. °· You have a fever. °SEEK IMMEDIATE MEDICAL CARE IF: °· Your pain does not go away within 2 hours. °· You keep throwing up (vomiting). °· Your pain is felt only in portions of the abdomen, such as the right side or the left lower portion of the abdomen. °· You pass bloody or black tarry stools. °MAKE SURE YOU: °· Understand these instructions. °· Will watch your condition. °· Will get help right away if you are not doing well or get worse. °  °This information is not intended to replace advice given to you by your health care provider. Make sure you discuss  any questions you have with your health care provider. °  °Document Released: 03/27/2005 Document Revised: 03/08/2015 Document Reviewed: 02/24/2013 °Elsevier Interactive Patient Education ©2016 [employer] ° °Abnormal Uterine Bleeding °Abnormal uterine bleeding can affect women at various stages in life, including teenagers, women in their reproductive years, pregnant women, and women who have reached menopause. Several kinds of uterine bleeding are considered abnormal, including: °· Bleeding or spotting between periods.   °· Bleeding after sexual intercourse.   °· Bleeding that is heavier or more than normal.   °· Periods that last longer than usual. °· Bleeding after menopause.   °Many cases of abnormal uterine bleeding are minor and simple to treat, while others are more serious. Any type of abnormal bleeding should be evaluated by your health care provider. Treatment will depend on the cause of the bleeding. °HOME CARE INSTRUCTIONS °Monitor your condition for any changes. The following actions may help to alleviate any discomfort you are experiencing: °· Avoid the use of tampons and douches as directed by your health care provider. °· Change your pads frequently. °You should get regular pelvic exams and Pap tests. Keep all follow-up appointments for diagnostic tests as directed by your health care provider.  °SEEK MEDICAL CARE IF:  °· Your bleeding lasts more than 1 week.   °· You feel dizzy at times.   °SEEK IMMEDIATE MEDICAL CARE IF:  °· You pass out.   °· You are changing pads every 15 to 30 minutes.   °· You have abdominal pain. °· You have   a fever.   °· You become sweaty or weak.   °· You are passing large blood clots from the vagina.   °· You start to feel nauseous and vomit. °MAKE SURE YOU:  °· Understand these instructions. °· Will watch your condition. °· Will get help right away if you are not doing well or get worse. °  °This information is not intended to replace advice given to you by your health  care provider. Make sure you discuss any questions you have with your health care provider. °  °Document Released: 06/17/2005 Document Revised: 06/22/2013 Document Reviewed: 01/14/2013 °Elsevier Interactive Patient Education ©2016 [employer] ° °

## 2015-12-12 ENCOUNTER — Telehealth: Payer: Self-pay | Admitting: *Deleted

## 2015-12-12 ENCOUNTER — Encounter: Payer: PRIVATE HEALTH INSURANCE | Admitting: Neurology

## 2015-12-12 NOTE — Telephone Encounter (Signed)
No showed [facility] appointment.

## 2015-12-13 ENCOUNTER — Encounter: Payer: Self-pay | Admitting: Neurology

## 2016-01-09 ENCOUNTER — Encounter: Payer: Self-pay | Admitting: Family Medicine

## 2016-01-09 ENCOUNTER — Ambulatory Visit (INDEPENDENT_AMBULATORY_CARE_PROVIDER_SITE_OTHER): Payer: PRIVATE HEALTH INSURANCE | Admitting: Family Medicine

## 2016-01-09 VITALS — BP 110/64 | Temp 98.2°F | Ht 65.0 in | Wt 275.0 lb

## 2016-01-09 DIAGNOSIS — J329 Chronic sinusitis, unspecified: Secondary | ICD-10-CM | POA: Diagnosis not present

## 2016-01-09 MED ORDER — AMOXICILLIN-POT CLAVULANATE 875-125 MG PO TABS: 1.0000 | ORAL_TABLET | Freq: Two times a day (BID) | ORAL | Status: DC

## 2016-01-09 MED ORDER — DOXYCYCLINE HYCLATE 100 MG PO TABS: ORAL_TABLET | ORAL | Status: DC

## 2016-01-09 NOTE — Progress Notes (Signed)
   Subjective:    Patient ID: Vanessa Booker, female    DOB: 01/20/1996, 20 y.o.   MRN: [id number]  Sinusitis This is a new problem. The current episode started in the past 7 days. Associated symptoms include congestion, headaches, sinus pressure and a sore throat. Treatments tried: Ibuprofen.   Sore throat off and on  Front and cheeks  Pos prod cough pos cong and guniness   Tightness in the chest  [facility] states no other concerns this visit.  Review of Systems  HENT: Positive for congestion, sinus pressure and sore throat.   Neurological: Positive for headaches.       Objective:   Physical Exam  Alert, mild malaise. Hydration good Vitals stable. frontal/ maxillary tenderness evident positive nasal congestion. pharynx normal neck supple  lungs clear/no crackles or wheezes. heart regular in rhythm       Assessment & Plan:  Impression rhinosinusitis likely post viral, discussed with patient. plan antibiotics prescribed. Questions answered. Symptomatic care discussed. warning signs discussed. WSL

## 2016-03-12 ENCOUNTER — Emergency Department
Admission: EM | Admit: 2016-03-12 | Discharge: 2016-03-12 | Disposition: A | Discharge status: Home / Self Care | Payer: Managed Care, Other (non HMO) | Attending: Emergency Medicine | Admitting: Emergency Medicine

## 2016-03-12 ENCOUNTER — Emergency Department: Payer: Managed Care, Other (non HMO)

## 2016-03-12 DIAGNOSIS — R61 Generalized hyperhidrosis: Secondary | ICD-10-CM

## 2016-03-12 DIAGNOSIS — F419 Anxiety disorder, unspecified: Secondary | ICD-10-CM | POA: Insufficient documentation

## 2016-03-12 DIAGNOSIS — R0789 Other chest pain: Secondary | ICD-10-CM | POA: Diagnosis present

## 2016-03-12 DIAGNOSIS — R079 Chest pain, unspecified: Secondary | ICD-10-CM

## 2016-03-12 DIAGNOSIS — R0602 Shortness of breath: Secondary | ICD-10-CM | POA: Diagnosis not present

## 2016-03-12 DIAGNOSIS — J45909 Unspecified asthma, uncomplicated: Secondary | ICD-10-CM | POA: Insufficient documentation

## 2016-03-12 DIAGNOSIS — Z791 Long term (current) use of non-steroidal anti-inflammatories (NSAID): Secondary | ICD-10-CM | POA: Diagnosis not present

## 2016-03-12 LAB — COMPREHENSIVE METABOLIC PANEL
ALBUMIN: 3.8 g/dL (ref 3.5–5.0)
ALT: 27 U/L (ref 14–54)
AST: 29 U/L (ref 15–41)
Alkaline Phosphatase: 55 U/L (ref 38–126)
Anion gap: 9 (ref 5–15)
BUN: 15 mg/dL (ref 6–20)
CHLORIDE: 105 mmol/L (ref 101–111)
CO2: 22 mmol/L (ref 22–32)
CREATININE: 0.7 mg/dL (ref 0.44–1.00)
Calcium: 9 mg/dL (ref 8.9–10.3)
GFR calc Af Amer: >60 mL/min (ref >60)
GFR calc non Af Amer: >60 mL/min (ref >60)
GLUCOSE: 194 mg/dL — AB (ref 65–99)
Potassium: 3.5 mmol/L (ref 3.5–5.1)
SODIUM: 136 mmol/L (ref 135–145)
Total Bilirubin: 0.6 mg/dL (ref 0.3–1.2)
Total Protein: 7.5 g/dL (ref 6.5–8.1)

## 2016-03-12 LAB — CBC
HCT: 39.1 % (ref 35.0–47.0)
Hemoglobin: 13.3 g/dL (ref 12.0–16.0)
MCH: 29 pg (ref 26.0–34.0)
MCHC: 34.1 g/dL (ref 32.0–36.0)
MCV: 85.2 fL (ref 80.0–100.0)
PLATELETS: 220 10*3/uL (ref 150–440)
RBC: 4.58 MIL/uL (ref 3.80–5.20)
RDW: 13.3 % (ref 11.5–14.5)
WBC: 6.3 10*3/uL (ref 3.6–11.0)

## 2016-03-12 LAB — TROPONIN I: Troponin I: <0.03 ng/mL (ref <0.03)

## 2016-03-12 NOTE — Discharge Instructions (Signed)
Please make a follow up appointment with your primary care physician.  Return to the emergency department for severe pain, lightheadedness, fainting, or any other symptoms concerning to you.

## 2016-03-12 NOTE — ED Provider Notes (Signed)
[facility] Emergency Department Provider Note  ____________________________________________  Time seen: Approximately 10:12 AM  I have reviewed the triage vital signs and the nursing notes.   HISTORY  Chief Complaint Chest Pain    HPI Vanessa Booker is a 20 y.o. female w/ morbid obesity brought by EMS for chest pain.  Patient reports that she was at work at [employer]6:45 AM when she developed an anxious feeling.  (The patient describes intermittent panic attacks, but has never seen a physician for this.) This was followed by a diffuse nonfocal chest "tightness" without any sharp pain or pleuritic component. No radiation. She became profusely diaphoretic and felt like she could not take deep breaths. She denies any lower extremity swelling, calf pain, recent travel. No personal or family history of blood clots. Patient denies cocaine use. The episode lasted until the paramedics arrived, and at this time, her symptoms have completely resolved.  SH: Denies tobacco and cocaine FH: Positive for advanced age CAD. Positive for an uncle with childhood death, "for heart complications."  Past Medical History:  Diagnosis Date  . Asthma   . Headaches due to old head injury   . History of PFTs sept 2013   normal   . Prediabetes   . Reflux     Patient Active Problem List   Diagnosis Date Noted  . Adjustment disorder with mixed anxiety and depressed mood 09/13/2013  . Folliculitis 07/27/2013  . Hidradenitis suppurativa 10/01/2012  . Prediabetes 09/21/2012    No past surgical history on file.  Current Outpatient Rx  . Order #: [id number] Class: Print  . Order #: [id number] Class: Print  . Order #: [id number] Class: Print  . Order #: [id number] Class: Normal  . Order #: [id number] Class: Print    Allergies Metformin and related; Cefazolin; and Cefzil [cefprozil]  Family History  Problem Relation Age of Onset  . Diabetes Mother   . Hypertension Mother   . Depression  Mother   . Kidney disease Mother   . Anxiety disorder Mother   . Cancer Father     passed away from colon cancer  . Cancer Maternal Aunt   . Anxiety disorder Maternal Aunt   . Depression Maternal Aunt   . Diabetes Maternal Grandfather   . Hypertension Maternal Grandfather   . Anxiety disorder Maternal Grandfather   . Depression Maternal Grandfather     Social History Social History  Substance Use Topics  . Smoking status: Never Smoker  . Smokeless tobacco: Never Used  . Alcohol use No    Review of Systems Constitutional: No fever/chills. No lightheadedness or syncope. Eyes: No visual changes. ENT: No sore throat. No congestion or rhinorrhea. Cardiovascular: Positive chest pain. Positive "fluttering."  Respiratory: Positive shortness of breath.  No cough. Gastrointestinal: No abdominal pain.  No nausea, no vomiting.  No diarrhea.  No constipation. Genitourinary: Negative for dysuria. Musculoskeletal: Negative for back pain. Skin: Negative for rash. Neurological: Negative for headaches. No focal numbness, tingling or weakness.  Psychiatric:Positive anxiety  10-point ROS otherwise negative.  ____________________________________________   PHYSICAL EXAM:  VITAL SIGNS: ED Triage Vitals  Enc Vitals Group     BP 03/12/16 0753 129/78     Pulse Rate 03/12/16 0753 78     Resp 03/12/16 0753 17     Temp 03/12/16 0753 98.3 F (36.8 C)     Temp Source 03/12/16 0753 Oral     SpO2 03/12/16 0753 99 %     Weight 03/12/16 0752 274 lb (124.3 kg)  Height 03/12/16 0752 5\' 6"  (1.676 m)     Head Circumference --      Peak Flow --      Pain Score 03/12/16 0753 6     Pain Loc --      Pain Edu? --      Excl. in GC? --     Constitutional: Alert and oriented. Well appearing and in no acute distress. Answers questions appropriately. Eyes: Conjunctivae are normal.  EOMI. No scleral icterus. Head: Atraumatic. Nose: No congestion/rhinnorhea. Mouth/Throat: Mucous membranes are  moist.  Neck: No stridor.  Supple.  No JVD. Cardiovascular: Normal rate, regular rhythm. No murmurs, rubs or gallops.  Respiratory: Normal respiratory effort.  No accessory muscle use or retractions. Lungs CTAB.  No wheezes, rales or ronchi. Gastrointestinal: Obese. Soft, nontender and nondistended.  No guarding or rebound.  No peritoneal signs. Musculoskeletal: No LE edema. No ttp in the calves or palpable cords.  Negative Homan's sign. Neurologic:  A&Ox3.  Speech is clear.  Face and smile are symmetric.  EOMI.  Moves all extremities well. Skin:  Skin is warm, dry and intact. No rash noted. Psychiatric: Mood and affect are normal. Speech and behavior are normal.  Normal judgement.  ____________________________________________   LABS (all labs ordered are listed, but only abnormal results are displayed)  Labs Reviewed  COMPREHENSIVE METABOLIC PANEL - Abnormal; Notable for the following:       Result Value   Glucose, Bld 194 (*)    All other components within normal limits  CBC  TROPONIN I  URINALYSIS COMPLETEWITH MICROSCOPIC ([facility] ONLY)   ____________________________________________  EKG  ED ECG REPORT I, Rockne Menghini, the attending physician, personally viewed and interpreted this ECG.   Date: 03/12/2016  EKG Time: 804  Rate: 79  Rhythm: normal sinus rhythm  Axis: Normal  Intervals:none  ST&T Change: No ST elevation. No evidence of hypertrophy. No prolonged QTC. No evidence of Brugada.  ____________________________________________  RADIOLOGY  Dg Chest 2 View  Result Date: 03/12/2016 CLINICAL DATA:  Chest discomfort today, lightheadedness, swelling, pre diabetic EXAM: CHEST  2 VIEW COMPARISON:  Chest x-ray of 06/15/2014 FINDINGS: No active infiltrate or effusion is seen. Mediastinal and hilar contours are unremarkable. The heart is within normal limits in size. No bony abnormality is seen. IMPRESSION: No active cardiopulmonary disease. Electronically Signed    By: Dwyane Dee M.D.   On: 03/12/2016 09:00    ____________________________________________   PROCEDURES  Procedure(s) performed: None  Procedures  Critical Care performed: No ____________________________________________   INITIAL IMPRESSION / ASSESSMENT AND PLAN / ED COURSE  Pertinent labs & imaging results that were available during my care of the patient were reviewed by me and considered in my medical decision making (see chart for details).  20 y.o. female with an episode of chest pain, shortness of breath, and diaphoresis in the setting of some anxiety, now completely resolved. Overall, the patient is well-appearing and has normal cardiopulmonary examination, as well as a normal lower extremity examination. The patient is hemodynamic a stable and does not have any ischemic changes on her EKG. There is no evidence of arrhythmia or life-threatening abnormality on her EKG. Her laboratory studies are reassuring and her chest x-ray does not show any evidence of hypertrophy. At this time, we'll plan discharge. She will make an appointment for follow-up with her primary care physician. We discussed return precautions as well as follow-up instructions.  ____________________________________________  FINAL CLINICAL IMPRESSION(S) / ED DIAGNOSES  Final diagnoses:  Chest pain,  unspecified chest pain type  Diaphoresis  Shortness of breath  Anxiety    Clinical Course      NEW MEDICATIONS STARTED DURING THIS VISIT:  New Prescriptions   No medications on file      Rockne Menghini, MD 03/12/16 1017

## 2016-03-12 NOTE — ED Triage Notes (Addendum)
Pt arrives via [facility] from her job with reports of chest discomfort that began this am  Pt reports 7/10 mid sternal chest pressure, lightheadedness and diaphoresis  CBG 224

## 2016-03-12 NOTE — ED Notes (Signed)
Report received from Amy RN  

## 2016-03-12 NOTE — ED Notes (Signed)
No discharge paperwork at this time

## 2016-03-14 ENCOUNTER — Encounter: Payer: Self-pay | Admitting: Family Medicine

## 2016-03-14 ENCOUNTER — Ambulatory Visit (INDEPENDENT_AMBULATORY_CARE_PROVIDER_SITE_OTHER): Payer: Managed Care, Other (non HMO) | Admitting: Family Medicine

## 2016-03-14 VITALS — BP 122/70 | HR 83 | Ht 66.0 in | Wt 281.0 lb

## 2016-03-14 DIAGNOSIS — M940 Chondrocostal junction syndrome [Tietze]: Secondary | ICD-10-CM

## 2016-03-14 DIAGNOSIS — F410 Panic disorder [episodic paroxysmal anxiety] without agoraphobia: Secondary | ICD-10-CM

## 2016-03-14 MED ORDER — CITALOPRAM HYDROBROMIDE 10 MG PO TABS: 10.0000 mg | ORAL_TABLET | Freq: Every day | ORAL | 2 refills | Status: DC

## 2016-03-14 NOTE — Progress Notes (Signed)
   Subjective:    Patient ID: Vanessa Booker, female    DOB: 03/22/1996, 20 y.o.   MRN: [id number]  Chest Pain   This is a new problem. Episode onset: 2 days ago. Treatments tried: went to Emergency room.  pt states she is having chest pressure and sob.  She states when she takes a deep breath she gets a sharp pain She states she's stressed a lot finds herself at times having panic attacks went to the ER with one in addition to this at times depressed and at other times has OCD tendencies. She has a hard time letting things go.   Review of Systems  Cardiovascular: Positive for chest pain.  Sharp chest pain with deep breath denies a numbness and tingling in the left or the right side. Denies any shortness of breath currently     Objective:   Physical Exam lungs are clear hearts regular pulse normal chest wall tender consistent with costochondritis moderate obesity noted      Assessment & Plan:  I believe this patient would benefit from counseling she does not one to do counseling currently she denies being suicidal or homicidal  I believe she does have a combination of panic attacks with some depression and anxiety symptoms area and I would not characterize a patient with major depression currently  Start citalopram 10 mg half tablet daily for the first week then 1 tablet daily ration was specifically warned that if she starts feeling severely depressed suicidal or worse to stop the medicine follow-up with us or ER immediately, patient was warned about possibility of side effects with medicines including risk for suicidal ideation and severe depression. The benefits of the medicine outweigh the risk The benefit of the medication outweighs the risk Follow-up here within 3 weeks.

## 2016-03-14 NOTE — Patient Instructions (Signed)
You have costochondritis. Ibuprofen 3 pills 3 times a day should help over the next 7-10 days.  I also believe that you're dealing with depression and panic attacks. Use Celexa 10 mg tablet as directed as we discussed. Remember if you start feeling suicidal or severely depressed stop the medicine immediately contact us in follow-up here or ER. I would like to see you back in 3 weeks' time.

## 2016-03-26 ENCOUNTER — Telehealth: Payer: Self-pay | Admitting: Family Medicine

## 2016-03-26 NOTE — Telephone Encounter (Signed)
Patient was prescribed citalopram (CELEXA) 10 MG tablet last week and she was told to call back if its giving her issues.  She said it is giving her "different type of thoughts" and making it worse.  Please advise.

## 2016-03-26 NOTE — Telephone Encounter (Signed)
I talked with the patient she states that the medication caused her to feel depressed and caused her to feel suicidal she stopped taking the medicine on Saturday she is feeling better now but she still has some spells of anxiousness she will come by tomorrow at 4 PM she denies being suicidal currently she states that she will seek help immediately if she starts feeling suicidal. We will see her tomorrow 4 PM

## 2016-03-26 NOTE — Telephone Encounter (Signed)
Left message to return call 

## 2016-03-27 ENCOUNTER — Ambulatory Visit (INDEPENDENT_AMBULATORY_CARE_PROVIDER_SITE_OTHER): Payer: Managed Care, Other (non HMO) | Admitting: Family Medicine

## 2016-03-27 ENCOUNTER — Encounter: Payer: Self-pay | Admitting: Family Medicine

## 2016-03-27 VITALS — BP 118/70 | Ht 66.0 in | Wt 279.2 lb

## 2016-03-27 DIAGNOSIS — R5383 Other fatigue: Secondary | ICD-10-CM

## 2016-03-27 DIAGNOSIS — F411 Generalized anxiety disorder: Secondary | ICD-10-CM | POA: Diagnosis not present

## 2016-03-27 DIAGNOSIS — R7303 Prediabetes: Secondary | ICD-10-CM

## 2016-03-27 MED ORDER — ALPRAZOLAM 0.25 MG PO TABS: 0.2500 mg | ORAL_TABLET | Freq: Two times a day (BID) | ORAL | 1 refills | Status: DC | PRN

## 2016-03-27 NOTE — Progress Notes (Signed)
   Subjective:    Patient ID: Vanessa Booker, female    DOB: 11/01/1995, 20 y.o.   MRN: [id number]  Anxiety  Presents for follow-up visit. Symptoms include excessive worry and nervous/anxious behavior. Primary symptoms comment: tachycardia. Symptoms occur most days. The quality of sleep is good.    This patient was treated with Celexa but then she started having increased depression and thoughts along with suicidal ideation she was told to stop the medicine she stop it this past Saturday she is actually been doing better since stopping it. Her energy level overall improving. Still has some intermittent anxiety. Patient states no other concern this visit.    Review of Systems  Psychiatric/Behavioral: The patient is nervous/anxious.   Denies being depressed denies being suicidal     Objective:   Physical Exam  Lungs clear hearts regular pulse normal  Patient has not had any further panic attacks   patient is do blood work on her prediabetes she is also having fatigue and tiredness we will also check some other tests Assessment & Plan:  Anxiety related issues Xanax as necessary referral for anxiety-related counseling cognitive behavioral therapy. Follow-up here in 3-4 months use Xanax sparingly caution drowsiness cautioned addictiveness

## 2016-03-28 NOTE — Progress Notes (Signed)
Referral ordered in EPIC. 

## 2016-03-28 NOTE — Addendum Note (Signed)
Addended by: Margaretha SheffieldBROWN, Kirsti Mcalpine S on: 03/28/2016 09:38 AM   Modules accepted: Orders

## 2016-04-01 ENCOUNTER — Encounter: Payer: Self-pay | Admitting: Family Medicine

## 2016-04-04 ENCOUNTER — Ambulatory Visit: Payer: Managed Care, Other (non HMO) | Admitting: Family Medicine

## 2016-04-14 ENCOUNTER — Emergency Department: Payer: PRIVATE HEALTH INSURANCE

## 2016-04-14 ENCOUNTER — Encounter: Payer: Self-pay | Admitting: Emergency Medicine

## 2016-04-14 ENCOUNTER — Emergency Department
Admission: EM | Admit: 2016-04-14 | Discharge: 2016-04-14 | Disposition: A | Discharge status: Home / Self Care | Payer: PRIVATE HEALTH INSURANCE | Attending: Emergency Medicine | Admitting: Emergency Medicine

## 2016-04-14 DIAGNOSIS — Z79899 Other long term (current) drug therapy: Secondary | ICD-10-CM | POA: Diagnosis not present

## 2016-04-14 DIAGNOSIS — J45909 Unspecified asthma, uncomplicated: Secondary | ICD-10-CM | POA: Diagnosis not present

## 2016-04-14 DIAGNOSIS — M79602 Pain in left arm: Secondary | ICD-10-CM | POA: Insufficient documentation

## 2016-04-14 DIAGNOSIS — Z791 Long term (current) use of non-steroidal anti-inflammatories (NSAID): Secondary | ICD-10-CM | POA: Diagnosis not present

## 2016-04-14 DIAGNOSIS — M7989 Other specified soft tissue disorders: Secondary | ICD-10-CM

## 2016-04-14 MED ORDER — SULFAMETHOXAZOLE-TRIMETHOPRIM 800-160 MG PO TABS: 1.0000 | ORAL_TABLET | Freq: Two times a day (BID) | ORAL | 0 refills | Status: DC

## 2016-04-14 MED ORDER — IBUPROFEN 800 MG PO TABS
800.0000 mg | ORAL_TABLET | Freq: Once | ORAL | Status: AC
Start: 2016-04-14 — End: 2016-04-14
  Administered 2016-04-14: 800 mg via ORAL
  Filled 2016-04-14: qty 1

## 2016-04-14 NOTE — ED Triage Notes (Signed)
Reports woke this afternoon and left wrist/hand swollen and decreased movement.  Denies any type of injury.  Slight swelling noted, no bruising or deformity noted.  Good left radial pulse.

## 2016-04-14 NOTE — ED Provider Notes (Signed)
[facility] Emergency Department Provider Note  ____________________________________________  Time seen: Approximately 8:21 PM  I have reviewed the triage vital signs and the nursing notes.   HISTORY  Chief Complaint Wrist Pain    HPI Vanessa Booker is a 20 y.o.LH female with prediabetes resenting with left upper extremity swelling and pain. The patient reports that she took a nap and when she awoke her left arm was more swollen than her right, with increased pain in the forearm and wrist, without any numbness, tingling, or weakness. She has no known injury or change in her work pattern. She has no personal or family history of blood clots. She did have a skin break from a staple in the left thumb 3 days ago.  No cp, sob, fever.   Past Medical History:  Diagnosis Date  . Asthma   . Headaches due to old head injury   . History of PFTs sept 2013   normal   . Prediabetes   . Reflux     Patient Active Problem List   Diagnosis Date Noted  . Adjustment disorder with mixed anxiety and depressed mood 09/13/2013  . Folliculitis 07/27/2013  . Hidradenitis suppurativa 10/01/2012  . Prediabetes 09/21/2012    History reviewed. No pertinent surgical history.  Current Outpatient Rx  . Order #: [id number] Class: Print  . Order #: [id number] Class: Print  . Order #: [id number] Class: Print  . Order #: [id number] Class: Print  . Order #: [id number] Class: Print    Allergies Celexa [citalopram hydrobromide]; Metformin and related; Cefazolin; and Cefzil [cefprozil]  Family History  Problem Relation Age of Onset  . Diabetes Mother   . Hypertension Mother   . Depression Mother   . Kidney disease Mother   . Anxiety disorder Mother   . Cancer Father     passed away from colon cancer  . Cancer Maternal Aunt   . Anxiety disorder Maternal Aunt   . Depression Maternal Aunt   . Diabetes Maternal Grandfather   . Hypertension Maternal Grandfather   . Anxiety disorder  Maternal Grandfather   . Depression Maternal Grandfather     Social History Social History  Substance Use Topics  . Smoking status: Never Smoker  . Smokeless tobacco: Never Used  . Alcohol use No    Review of Systems Constitutional: No fever/chills. Eyes: No visual changes. ENT: No sore throat. No congestion or rhinorrhea. Cardiovascular: Denies chest pain. Denies palpitations. Respiratory: Denies shortness of breath.  No cough. Gastrointestinal: No abdominal pain.  No nausea, no vomiting.  No diarrhea.  No constipation. Genitourinary: Negative for dysuria. Musculoskeletal: Negative for back pain.  Pos for LUE swelling, pain. Skin: Negative for rash. Neurological: Negative for headaches. No focal numbness, tingling or weakness.   10-point ROS otherwise negative.  ____________________________________________   PHYSICAL EXAM:  VITAL SIGNS: ED Triage Vitals  Enc Vitals Group     BP 04/14/16 1952 130/75     Pulse Rate 04/14/16 1952 85     Resp 04/14/16 1952 16     Temp 04/14/16 1952 98.3 F (36.8 C)     Temp Source 04/14/16 1952 Oral     SpO2 04/14/16 1952 100 %     Weight 04/14/16 1953 274 lb (124.3 kg)     Height 04/14/16 1953 5\' 6"  (1.676 m)     Head Circumference --      Peak Flow --      Pain Score --      Pain Loc --  Pain Edu? --      Excl. in GC? --     Constitutional: Alert and oriented. Well appearing and in no acute distress. Answers questions appropriately. Eyes: Conjunctivae are normal.  EOMI. No scleral icterus. Head: Atraumatic. Nose: No congestion/rhinnorhea. Mouth/Throat: Mucous membranes are moist.  Neck: No stridor.  Supple.   Cardiovascular: Normal rate, regular rhythm. No murmurs, rubs or gallops.  Respiratory: Normal respiratory effort.  No accessory muscle use or retractions. Lungs CTAB.  No wheezes, rales or ronchi. Musculoskeletal: Positive mild left upper extremity swelling on the left most prominent diffusely in the forearm and  minimally in the left hand without any ecchymosis, or obvious skin break. Some patchy erythematous discoloration that is symmetric to the right arm. No overlying skin warmth. Normal left radial pulse. 5 out of 5 left grip strength. Normal sensation to light touch diffusely in the left upper extremity.  Neurologic:  A&Ox3.  Speech is clear.  Face and smile are symmetric.  EOMI.  Moves all extremities well. Skin:  Skin is warm, dry and intact. No rash noted. Psychiatric: Mood and affect are normal. Speech and behavior are normal.  Normal judgement.  ____________________________________________   LABS (all labs ordered are listed, but only abnormal results are displayed)  Labs Reviewed - No data to display ____________________________________________  EKG  Not indicated ____________________________________________  RADIOLOGY  US Venous Img Upper Uni Left  Result Date: 04/14/2016 CLINICAL DATA:  20 year old female with left arm swelling and pain. EXAM: Left UPPER EXTREMITY VENOUS DOPPLER ULTRASOUND TECHNIQUE: Gray-scale sonography with graded compression, as well as color Doppler and duplex ultrasound were performed to evaluate the upper extremity deep venous system from the level of the subclavian vein and including the jugular, axillary, basilic, radial, ulnar and upper cephalic vein. Spectral Doppler was utilized to evaluate flow at rest and with distal augmentation maneuvers. COMPARISON:  None. FINDINGS: Contralateral Subclavian Vein: Respiratory phasicity is normal and symmetric with the symptomatic side. No evidence of thrombus. Normal compressibility. Internal Jugular Vein: No evidence of thrombus. Normal compressibility, respiratory phasicity and response to augmentation. Subclavian Vein: No evidence of thrombus. Normal compressibility, respiratory phasicity and response to augmentation. Axillary Vein: No evidence of thrombus. Normal compressibility, respiratory phasicity and response to  augmentation. Cephalic Vein: No evidence of thrombus. Normal compressibility, respiratory phasicity and response to augmentation. Basilic Vein: No evidence of thrombus. Normal compressibility, respiratory phasicity and response to augmentation. Brachial Veins: No evidence of thrombus. Normal compressibility, respiratory phasicity and response to augmentation. Radial Veins: No evidence of thrombus. Normal compressibility, respiratory phasicity and response to augmentation. Ulnar Veins: No evidence of thrombus. Normal compressibility, respiratory phasicity and response to augmentation. Venous Reflux:  None visualized. Other Findings:  None visualized. IMPRESSION: No evidence of deep venous thrombosis in the left upper extremity. Electronically Signed   By: Elgie Collard M.D.   On: 04/14/2016 21:52    ____________________________________________   PROCEDURES  Procedure(s) performed: None  Procedures  Critical Care performed: No ____________________________________________   INITIAL IMPRESSION / ASSESSMENT AND PLAN / ED COURSE  Pertinent labs & imaging results that were available during my care of the patient were reviewed by me and considered in my medical decision making (see chart for details).  20 y.o. female with prediabetes presenting with left upper extremity swelling and pain, recent history of minimal puncture wound. Overall, the patient is well-appearing with reassuring vital signs. Her left upper extremity swelling is unlikely to be from a traumatic incident, since she does not remember any  recent trauma. I do not suspect osseous involvement, with fracture. No imaging for bony involvement is indicated at this time. I will get an ultrasound to rule out left upper extremity DVT. It is also possible that given the recent staple injury, the patient has an early attestation of cellulitis. If the patient's DVT study is negative, I'll plan to treat her with 7 days of antibiotics for presumed  cellulitis, as well as elevation and ice for symptomatically controlled.  We will initiate Motrin, elevation and ice in the emergency department.  ----------------------------------------- 10:04 PM on 04/14/2016 -----------------------------------------  The patient is feeling better at this time. Her ultrasound is negative for an acute clot. We'll plan discharge home with close PMD follow-up. Return precautions as well as follow-up instructions were discussed with the patient. ____________________________________________  FINAL CLINICAL IMPRESSION(S) / ED DIAGNOSES  Final diagnoses:  Left arm swelling  Left arm pain    Clinical Course      NEW MEDICATIONS STARTED DURING THIS VISIT:  New Prescriptions   SULFAMETHOXAZOLE-TRIMETHOPRIM (BACTRIM DS,SEPTRA DS) 800-160 MG TABLET    Take 1 tablet by mouth 2 (two) times daily.      Rockne Menghini, MD 04/14/16 2205

## 2016-04-14 NOTE — Discharge Instructions (Signed)
Please elevate your arm above the level of your heart as much as possible to decrease swelling and pain. You may also use ice for 10 minutes every 2 hours to decrease swelling and pain. You may take Tylenol or Motrin for ear pain.  Return to the emergency department if you develop worsening swelling, skin changes, fever, chills, or any other symptoms concerning to you.

## 2016-04-15 ENCOUNTER — Encounter: Payer: Self-pay | Admitting: Family Medicine

## 2016-04-15 ENCOUNTER — Encounter: Payer: Self-pay | Admitting: Nurse Practitioner

## 2016-04-15 ENCOUNTER — Ambulatory Visit (HOSPITAL_COMMUNITY)
Admission: RE | Admit: 2016-04-15 | Discharge: 2016-04-15 | Disposition: A | Discharge status: Home / Self Care | Payer: Managed Care, Other (non HMO) | Source: Ambulatory Visit | Attending: Nurse Practitioner | Admitting: Nurse Practitioner

## 2016-04-15 ENCOUNTER — Ambulatory Visit (INDEPENDENT_AMBULATORY_CARE_PROVIDER_SITE_OTHER): Payer: Managed Care, Other (non HMO) | Admitting: Nurse Practitioner

## 2016-04-15 VITALS — BP 110/68 | Ht 66.0 in | Wt 284.0 lb

## 2016-04-15 DIAGNOSIS — M25532 Pain in left wrist: Secondary | ICD-10-CM | POA: Diagnosis present

## 2016-04-15 DIAGNOSIS — M79602 Pain in left arm: Secondary | ICD-10-CM | POA: Insufficient documentation

## 2016-04-15 DIAGNOSIS — M25522 Pain in left elbow: Secondary | ICD-10-CM | POA: Diagnosis not present

## 2016-04-15 NOTE — Progress Notes (Signed)
Subjective:  Presents for recheck of left lower arm pain. Was seen in [place] ED last night. Did testing for blood clots which was negative. Did not x-ray. History of a small puncture on one of her fingers so they started her on antibiotics which she has not filled yet. No fever. No neck or shoulder pain. Began yesterday evening after stretching, felt her elbow pop, no immediate pain. Laid down for a nap and when she woke up she felt soreness in some discomfort along her lower arm from the elbow to the fingers. Symptoms got progressively worse. Has difficulty with full extension of the arm and with using her fingers. Is taking ibuprofen 800 mg and applying ice with minimal improvement.   Objective: BP 110/68   Ht 5\' 6"  (1.676 m)   Wt 284 lb (128.8 kg)   LMP  (LMP Unknown)   BMI 45.84 kg/m  NAD. Alert, oriented. Lungs clear. Heart regular rate rhythm. Normal ROM of the neck and left shoulder without tenderness or limitation. Mild pain at the left elbow only with full extension of the arm, none to palpation. No tenderness to palpation of the left forearm until the left wrist area which is very tender. Only minimal movement of the fingers when checking hand strength. Strong radial pulse. Venous Doppler ultrasound of the left extremity was normal.   Assessment: Elbow pain, left - Plan: DG Elbow Complete Left  Left arm pain  Wrist pain, left - Plan: DG Wrist Complete Left  Plan:  x-rays pending. Further follow-up based on results. For now continue ice applications and ibuprofen. Given a note for work, she is unable to perform her job since she is unable to use her left hand.

## 2016-04-15 NOTE — Addendum Note (Signed)
Addended by: Theodora BlowREWS, Marga Gramajo R on: 04/15/2016 03:54 PM   Modules accepted: Orders

## 2016-04-16 ENCOUNTER — Encounter: Payer: Self-pay | Admitting: Family Medicine

## 2016-04-19 ENCOUNTER — Telehealth: Payer: Self-pay | Admitting: Nurse Practitioner

## 2016-04-19 NOTE — Telephone Encounter (Signed)
That's ok. I did not think about it since it was not there. Will get this done soon. Thanks!

## 2016-04-19 NOTE — Telephone Encounter (Signed)
Sorry for the misunderstanding, we found and put this on your desk.

## 2016-04-19 NOTE — Telephone Encounter (Signed)
Calling to find out if FMLA has been faxed to her job yet?

## 2016-04-19 NOTE — Telephone Encounter (Signed)
It was never put on my desk. She left it up front the day she came in. Please check and see if it is there now and if not can we track it down? I will stop by within the next few days and complete. Thanks!

## 2016-04-23 ENCOUNTER — Telehealth (HOSPITAL_COMMUNITY): Payer: Self-pay | Admitting: *Deleted

## 2016-04-23 NOTE — Telephone Encounter (Signed)
RETURNED PHONE CALL REGARDING APPOINTMENT. 

## 2016-04-23 NOTE — Telephone Encounter (Signed)
LEFT VOICE MESSAGE REGARDING APPOINTMENT 

## 2016-05-08 ENCOUNTER — Telehealth: Payer: Self-pay | Admitting: Family Medicine

## 2016-05-08 DIAGNOSIS — R5383 Other fatigue: Secondary | ICD-10-CM

## 2016-05-08 DIAGNOSIS — R7303 Prediabetes: Secondary | ICD-10-CM

## 2016-05-08 DIAGNOSIS — Z13220 Encounter for screening for lipoid disorders: Secondary | ICD-10-CM

## 2016-05-08 NOTE — Telephone Encounter (Signed)
Pt is needing lab orders sent over for an upcoming physical. Last labs per epic were: lipid and a1c on 09/13/13. Pt stated that she was unable to do the ones on 9/27 due to owing [facility] a bill. Pt is going to have to do these at [facility] due to that reason.

## 2016-05-09 NOTE — Telephone Encounter (Signed)
Blood work ordered in EPIC. Patient notified. 

## 2016-05-09 NOTE — Telephone Encounter (Signed)
I recommend a lipid, liver, metabolic 7, A1c, TSH-screening, fatigue, obesity

## 2016-06-04 ENCOUNTER — Encounter: Payer: Managed Care, Other (non HMO) | Admitting: Nurse Practitioner

## 2016-06-27 ENCOUNTER — Encounter: Payer: Self-pay | Admitting: Nurse Practitioner

## 2016-06-27 ENCOUNTER — Ambulatory Visit (INDEPENDENT_AMBULATORY_CARE_PROVIDER_SITE_OTHER): Payer: Managed Care, Other (non HMO) | Admitting: Nurse Practitioner

## 2016-06-27 VITALS — BP 122/82 | HR 112 | Temp 99.1°F | Wt 282.2 lb

## 2016-06-27 DIAGNOSIS — B9689 Other specified bacterial agents as the cause of diseases classified elsewhere: Secondary | ICD-10-CM | POA: Diagnosis not present

## 2016-06-27 DIAGNOSIS — J069 Acute upper respiratory infection, unspecified: Secondary | ICD-10-CM | POA: Diagnosis not present

## 2016-06-27 MED ORDER — HYDROCODONE-HOMATROPINE 5-1.5 MG/5ML PO SYRP: 5.0000 mL | ORAL_SOLUTION | ORAL | 0 refills | Status: DC | PRN

## 2016-06-27 MED ORDER — AZITHROMYCIN 250 MG PO TABS
ORAL_TABLET | ORAL | 0 refills | Status: DC
Start: 2016-06-27 — End: 2016-08-15

## 2016-06-27 NOTE — Progress Notes (Signed)
Subjective:  Presents for complaints of congestion for the past 2 weeks. Worse over the past 24 hours. Low-grade fever. Sore throat. Frontal area headache. Runny nose and head congestion. Frequent cough especially at nighttime. Yellow nasal drainage. Bilateral ear pain. No wheezing.  Objective:   BP 122/82   Pulse (!) 112   Temp 99.1 F (37.3 C) (Oral)   Wt 282 lb 3.2 oz (128 kg)   SpO2 96%   BMI 45.55 kg/m  NAD. Alert, oriented. TMs clear effusion, no erythema. Pharynx mildly injected with PND noted. Neck supple with mild soft anterior adenopathy. Mildly tender to palpation. Lungs clear. Heart regular rhythm.  Assessment: Bacterial upper respiratory infection  Plan:  Meds ordered this encounter  Medications  . azithromycin (ZITHROMAX Z-PAK) 250 MG tablet    Sig: Take 2 tablets (500 mg) on  Day 1,  followed by 1 tablet (250 mg) once daily on Days 2 through 5.    Dispense:  6 each    Refill:  0    Order Specific Question:   Supervising Provider    Answer:   Merlyn AlbertLUKING, WILLIAM S [[id number]]  . HYDROcodone-homatropine (HYCODAN) 5-1.5 MG/5ML syrup    Sig: Take 5 mLs by mouth every 4 (four) hours as needed.    Dispense:  90 mL    Refill:  0    Order Specific Question:   Supervising Provider    Answer:   Merlyn AlbertLUKING, WILLIAM S [[id number]]   Possible superimposed viral illness but will cover with antibiotics due to length of time she has had head congestion, now having yellow color. Call back if symptoms worsen or persist.

## 2016-07-02 ENCOUNTER — Ambulatory Visit (HOSPITAL_COMMUNITY): Payer: Self-pay | Admitting: Psychiatry

## 2016-07-03 ENCOUNTER — Other Ambulatory Visit: Payer: Self-pay | Admitting: Family Medicine

## 2016-07-03 LAB — LIPID PANEL
Cholesterol: 171 mg/dL (ref <200)
HDL: 29 mg/dL — AB (ref >50)
LDL CALC: 98 mg/dL (ref <100)
Total CHOL/HDL Ratio: 5.9 Ratio — ABNORMAL HIGH (ref <5.0)
Triglycerides: 222 mg/dL — ABNORMAL HIGH (ref <150)
VLDL: 44 mg/dL — AB (ref <30)

## 2016-07-03 LAB — BASIC METABOLIC PANEL
BUN: 13 mg/dL (ref 7–25)
CHLORIDE: 104 mmol/L (ref 98–110)
CO2: 18 mmol/L — AB (ref 20–31)
CREATININE: 0.61 mg/dL (ref 0.50–1.10)
Calcium: 9.4 mg/dL (ref 8.6–10.2)
Glucose, Bld: 84 mg/dL (ref 65–99)
POTASSIUM: 4.4 mmol/L (ref 3.5–5.3)
Sodium: 141 mmol/L (ref 135–146)

## 2016-07-03 LAB — HEMOGLOBIN A1C
HEMOGLOBIN A1C: 5.7 % — AB (ref <5.7)
MEAN PLASMA GLUCOSE: 117 mg/dL

## 2016-07-03 LAB — HEPATIC FUNCTION PANEL
ALBUMIN: 4.1 g/dL (ref 3.6–5.1)
ALK PHOS: 66 U/L (ref 33–115)
ALT: 20 U/L (ref 6–29)
AST: 16 U/L (ref 10–30)
BILIRUBIN INDIRECT: 0.4 mg/dL (ref 0.2–1.2)
BILIRUBIN TOTAL: 0.5 mg/dL (ref 0.2–1.2)
Bilirubin, Direct: 0.1 mg/dL (ref <=0.2)
Total Protein: 7.2 g/dL (ref 6.1–8.1)

## 2016-07-03 LAB — TSH: TSH: 3.01 mIU/L

## 2016-07-04 ENCOUNTER — Encounter: Payer: Managed Care, Other (non HMO) | Admitting: Nurse Practitioner

## 2016-07-04 DIAGNOSIS — Z0289 Encounter for other administrative examinations: Secondary | ICD-10-CM
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
## 2016-07-30 ENCOUNTER — Encounter: Payer: Self-pay | Admitting: Family Medicine

## 2016-07-31 ENCOUNTER — Encounter: Payer: Self-pay | Admitting: Family Medicine

## 2016-07-31 ENCOUNTER — Encounter: Payer: Self-pay | Admitting: Nurse Practitioner

## 2016-07-31 ENCOUNTER — Ambulatory Visit (INDEPENDENT_AMBULATORY_CARE_PROVIDER_SITE_OTHER): Payer: Managed Care, Other (non HMO) | Admitting: Nurse Practitioner

## 2016-07-31 VITALS — BP 122/84 | Ht 66.0 in | Wt 283.8 lb

## 2016-07-31 DIAGNOSIS — L0291 Cutaneous abscess, unspecified: Secondary | ICD-10-CM

## 2016-07-31 DIAGNOSIS — Z01419 Encounter for gynecological examination (general) (routine) without abnormal findings: Secondary | ICD-10-CM | POA: Diagnosis not present

## 2016-07-31 MED ORDER — AMOXICILLIN-POT CLAVULANATE 875-125 MG PO TABS: 1.0000 | ORAL_TABLET | Freq: Two times a day (BID) | ORAL | 0 refills | Status: DC

## 2016-07-31 MED ORDER — PHENTERMINE HCL 37.5 MG PO TABS: 37.5000 mg | ORAL_TABLET | Freq: Every day | ORAL | 0 refills | Status: DC

## 2016-08-01 ENCOUNTER — Encounter: Payer: Self-pay | Admitting: Nurse Practitioner
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
## 2016-08-15 ENCOUNTER — Encounter: Payer: Self-pay | Admitting: Family Medicine

## 2016-08-15 ENCOUNTER — Ambulatory Visit (INDEPENDENT_AMBULATORY_CARE_PROVIDER_SITE_OTHER): Payer: Managed Care, Other (non HMO) | Admitting: Family Medicine

## 2016-08-15 VITALS — BP 118/74 | Temp 97.5°F | Ht 66.0 in | Wt 282.0 lb

## 2016-08-15 DIAGNOSIS — A084 Viral intestinal infection, unspecified: Secondary | ICD-10-CM | POA: Diagnosis not present

## 2016-08-15 MED ORDER — ONDANSETRON 4 MG PO TBDP: 4.0000 mg | ORAL_TABLET | Freq: Three times a day (TID) | ORAL | 0 refills | Status: DC | PRN

## 2016-08-15 NOTE — Progress Notes (Signed)
   Subjective:    Patient ID: Vanessa Booker, female    DOB: 03/15/1996, 21 y.o.   MRN: [id number]  Abdominal Pain  This is a new problem. The current episode started yesterday. Associated symptoms include diarrhea, headaches and vomiting. She has tried nothing for the symptoms.   yest very sig stomach pain  Had chills    Got nauseated  This moren woke up vomiting  Felt nauseated all day  Not running fever   Some diarhea and headache  Gi symtoms I the family     Review of Systems  Gastrointestinal: Positive for abdominal pain, diarrhea and vomiting.  Neurological: Positive for headaches.       Objective:   Physical Exam  Alert active good hydration no acute distress HEENT normal lungs clear. Heart rare rhythm. Abdomen hyperactive bowel sounds diffusely no discrete tenderness mild upper abdominal discomfort      Assessment & Plan:  Impression viral gastritis plan symptom care discussed warning signs discussed Zofran when necessary for nausea may use over-the-counter Imodium expect gradual resolution

## 2016-09-19 ENCOUNTER — Ambulatory Visit (INDEPENDENT_AMBULATORY_CARE_PROVIDER_SITE_OTHER): Payer: Managed Care, Other (non HMO) | Admitting: Nurse Practitioner

## 2016-09-19 ENCOUNTER — Encounter: Payer: Self-pay | Admitting: Nurse Practitioner

## 2016-09-19 VITALS — BP 124/64 | Ht 66.0 in | Wt 272.0 lb

## 2016-09-19 DIAGNOSIS — L739 Follicular disorder, unspecified: Secondary | ICD-10-CM | POA: Diagnosis not present

## 2016-09-19 DIAGNOSIS — L732 Hidradenitis suppurativa: Secondary | ICD-10-CM | POA: Diagnosis not present

## 2016-09-19 MED ORDER — CLINDAMYCIN HCL 300 MG PO CAPS: 300.0000 mg | ORAL_CAPSULE | Freq: Three times a day (TID) | ORAL | 0 refills | Status: DC

## 2016-09-19 MED ORDER — PHENTERMINE HCL 37.5 MG PO TABS
37.5000 mg | ORAL_TABLET | Freq: Every day | ORAL | 2 refills | Status: DC
Start: 2016-09-19 — End: 2016-12-19

## 2016-09-20 ENCOUNTER — Encounter: Payer: Self-pay | Admitting: Nurse Practitioner

## 2016-09-20 NOTE — Progress Notes (Signed)
Subjective:  Presents for recheck on Phentermine. Has increased activity. Doing well with diet. Weighed today with steel toed shoes. Weighed 268 outside of office. Denies any adverse effects with Phentermine. Has a "knot" on the right side of the scalp for the past several days. No fever. Also mentions that area under her breast is better but still draining.   Objective:   BP 124/64   Ht 5\' 6"  (1.676 m)   Wt 272 lb (123.4 kg)   BMI 43.90 kg/m  NAD. Alert, oriented. Lungs clear. Heart RRR. Small raised slightly purplish lesion with tiny early white pustule on right side of scalp. Mildly tender to palpation. Hair is cut very short. Under right breast an oval open area is noted with small amount of yellowish drainage.   Assessment:   Problem List Items Addressed This Visit      Musculoskeletal and Integument   Folliculitis   Hidradenitis suppurativa - Primary (Chronic)   Relevant Medications   clindamycin (CLEOCIN) 300 MG capsule     Other   Morbid obesity (HCC)   Relevant Medications   phentermine (ADIPEX-P) 37.5 MG tablet       Plan:   Meds ordered this encounter  Medications  . phentermine (ADIPEX-P) 37.5 MG tablet    Sig: Take 1 tablet (37.5 mg total) by mouth daily before breakfast.    Dispense:  30 tablet    Refill:  2    Order Specific Question:   Supervising Provider    Answer:   Merlyn AlbertLUKING, WILLIAM S [[id number]]  . clindamycin (CLEOCIN) 300 MG capsule    Sig: Take 1 capsule (300 mg total) by mouth 3 (three) times daily.    Dispense:  30 capsule    Refill:  0    Order Specific Question:   Supervising Provider    Answer:   Merlyn AlbertLUKING, WILLIAM S [[id number]]   Recommend referral to dermatology but patient wishes to complete clindamycin first. To call back if area under breast is not healed. Recheck in 3 months if she wishes to continue Phentermine.

## 2016-10-08 ENCOUNTER — Telehealth: Payer: Self-pay | Admitting: Family Medicine

## 2016-10-08 DIAGNOSIS — L989 Disorder of the skin and subcutaneous tissue, unspecified: Secondary | ICD-10-CM

## 2016-10-08 DIAGNOSIS — S21009D Unspecified open wound of unspecified breast, subsequent encounter: Secondary | ICD-10-CM

## 2016-10-08 MED ORDER — DOXYCYCLINE HYCLATE 100 MG PO TABS: 100.0000 mg | ORAL_TABLET | Freq: Two times a day (BID) | ORAL | 0 refills | Status: DC

## 2016-10-08 NOTE — Telephone Encounter (Signed)
Patient said place under breast and spot on head is getting worse. Vanessa Booker was going to refer her to a dermatologist. Wants to go ahead and get that referral set up as soon as possible. [phone]

## 2016-10-08 NOTE — Telephone Encounter (Signed)
Prescription sent electronically to pharmacy. Referral ordered in EPIC. Patient notified. 

## 2016-10-08 NOTE — Telephone Encounter (Signed)
I recommend referral to dermatology asap, also doxycycline 100 mg 1 twice a day with the snack and a tall glass of liquids twice daily for the next 7 days

## 2016-10-08 NOTE — Telephone Encounter (Signed)
See 09/19/16 visit

## 2016-10-09 ENCOUNTER — Encounter: Payer: Self-pay | Admitting: Family Medicine

## 2016-11-07 ENCOUNTER — Encounter: Payer: Self-pay | Admitting: Family Medicine

## 2016-12-19 ENCOUNTER — Encounter: Payer: Self-pay | Admitting: Nurse Practitioner

## 2016-12-19 ENCOUNTER — Ambulatory Visit (INDEPENDENT_AMBULATORY_CARE_PROVIDER_SITE_OTHER): Payer: Managed Care, Other (non HMO) | Admitting: Nurse Practitioner

## 2016-12-19 VITALS — BP 112/64 | Temp 98.5°F | Ht 66.0 in | Wt 231.0 lb

## 2016-12-19 DIAGNOSIS — J029 Acute pharyngitis, unspecified: Secondary | ICD-10-CM | POA: Diagnosis not present

## 2016-12-19 LAB — POCT RAPID STREP A (OFFICE): Rapid Strep A Screen: NEGATIVE

## 2016-12-19 MED ORDER — PHENTERMINE HCL 37.5 MG PO TABS
37.5000 mg | ORAL_TABLET | Freq: Every day | ORAL | 1 refills | Status: DC
Start: 2016-12-19 — End: 2017-11-18

## 2016-12-19 NOTE — Progress Notes (Signed)
Subjective:  Presents for recheck on her weight. Doing very well with weight loss. Working out on a regular basis. Has joined [employer]. Doing much better with her diet. Also complaints of sore throat for the past couple of days. Her nephew is living in the home now, was diagnosed with strep throat recently. Random 102 temp last night. Some mild head congestion. Occasional cough. Headache. Fatigue. No ear pain.  Objective:   BP 112/64   Temp 98.5 F (36.9 C) (Oral)   Ht 5\' 6"  (1.676 m)   Wt 231 lb (104.8 kg)   BMI 37.28 kg/m  NAD. Alert, oriented. TMs retracted, no erythema. Pharynx minimal erythema, PND noted. Neck supple with mild soft slightly tender anterior adenopathy. Lungs clear. Heart regular rate rhythm. Results for orders placed or performed in visit on 12/19/16  POCT rapid strep A  Result Value Ref Range   Rapid Strep A Screen Negative Negative   Has lost 52 pounds since her visit in January.  Assessment:   Problem List Items Addressed This Visit      Other   Morbid obesity (HCC)   Relevant Medications   phentermine (ADIPEX-P) 37.5 MG tablet    Other Visit Diagnoses    Sore throat    -  Primary   Relevant Orders   POCT rapid strep A (Completed)   Strep A DNA probe       Plan:   Meds ordered this encounter  Medications  . phentermine (ADIPEX-P) 37.5 MG tablet    Sig: Take 1 tablet (37.5 mg total) by mouth daily before breakfast.    Dispense:  30 tablet    Refill:  1    Order Specific Question:   Supervising Provider    Answer:   Merlyn AlbertLUKING, WILLIAM S [[id number]]   Continue phentermine for 2 more months then discontinue. Into other weight loss efforts. Throat culture pending. Reviewed symptomatic care and warning signs. Call back early next week if no improvement, sooner if worse.

## 2016-12-20 LAB — STREP A DNA PROBE: Strep Gp A Direct, DNA Probe: NEGATIVE

## 2017-01-29 ENCOUNTER — Emergency Department
Admission: EM | Admit: 2017-01-29 | Discharge: 2017-01-29 | Disposition: A | Discharge status: Home / Self Care | Payer: PRIVATE HEALTH INSURANCE | Attending: Emergency Medicine | Admitting: Emergency Medicine

## 2017-01-29 ENCOUNTER — Encounter: Payer: Self-pay | Admitting: *Deleted

## 2017-01-29 DIAGNOSIS — R112 Nausea with vomiting, unspecified: Secondary | ICD-10-CM | POA: Insufficient documentation

## 2017-01-29 DIAGNOSIS — R197 Diarrhea, unspecified: Secondary | ICD-10-CM | POA: Diagnosis not present

## 2017-01-29 DIAGNOSIS — Z79899 Other long term (current) drug therapy: Secondary | ICD-10-CM | POA: Insufficient documentation

## 2017-01-29 DIAGNOSIS — J45909 Unspecified asthma, uncomplicated: Secondary | ICD-10-CM | POA: Insufficient documentation

## 2017-01-29 LAB — CBC
HEMATOCRIT: 41.1 % (ref 35.0–47.0)
Hemoglobin: 14 g/dL (ref 12.0–16.0)
MCH: 28.9 pg (ref 26.0–34.0)
MCHC: 34.1 g/dL (ref 32.0–36.0)
MCV: 84.7 fL (ref 80.0–100.0)
PLATELETS: 238 10*3/uL (ref 150–440)
RBC: 4.85 MIL/uL (ref 3.80–5.20)
RDW: 13.6 % (ref 11.5–14.5)
WBC: 7.9 10*3/uL (ref 3.6–11.0)

## 2017-01-29 LAB — COMPREHENSIVE METABOLIC PANEL
ALT: 18 U/L (ref 14–54)
AST: 17 U/L (ref 15–41)
Albumin: 3.9 g/dL (ref 3.5–5.0)
Alkaline Phosphatase: 64 U/L (ref 38–126)
Anion gap: 9 (ref 5–15)
BILIRUBIN TOTAL: 1.5 mg/dL — AB (ref 0.3–1.2)
BUN: 14 mg/dL (ref 6–20)
CO2: 24 mmol/L (ref 22–32)
Calcium: 9 mg/dL (ref 8.9–10.3)
Chloride: 103 mmol/L (ref 101–111)
Creatinine, Ser: 0.6 mg/dL (ref 0.44–1.00)
Glucose, Bld: 102 mg/dL — ABNORMAL HIGH (ref 65–99)
Potassium: 3.7 mmol/L (ref 3.5–5.1)
Sodium: 136 mmol/L (ref 135–145)
TOTAL PROTEIN: 7.7 g/dL (ref 6.5–8.1)

## 2017-01-29 LAB — LIPASE, BLOOD: Lipase: 29 U/L (ref 11–51)

## 2017-01-29 MED ORDER — LOPERAMIDE HCL 2 MG PO CAPS
ORAL_CAPSULE | ORAL | Status: AC
  Administered 2017-01-29: 4 mg via ORAL
  Filled 2017-01-29: qty 2

## 2017-01-29 MED ORDER — ONDANSETRON 4 MG PO TBDP
ORAL_TABLET | ORAL | Status: AC
  Administered 2017-01-29: 4 mg via ORAL
  Filled 2017-01-29: qty 1

## 2017-01-29 MED ORDER — ONDANSETRON 4 MG PO TBDP: 4.0000 mg | ORAL_TABLET | Freq: Three times a day (TID) | ORAL | 0 refills | Status: DC | PRN

## 2017-01-29 MED ORDER — LOPERAMIDE HCL 2 MG PO CAPS
4.0000 mg | ORAL_CAPSULE | Freq: Once | ORAL | Status: AC
  Administered 2017-01-29: 4 mg via ORAL

## 2017-01-29 MED ORDER — ONDANSETRON 4 MG PO TBDP
4.0000 mg | ORAL_TABLET | Freq: Once | ORAL | Status: AC
Start: 2017-01-29 — End: 2017-01-29
  Administered 2017-01-29: 4 mg via ORAL

## 2017-01-29 NOTE — ED Triage Notes (Signed)
Pt has abd pain with v/d that began this morning.  Family of 3 with similar sx.  Vomiting x 1 today.   Pt alert.

## 2017-01-29 NOTE — ED Provider Notes (Signed)
[facility] Emergency Department Provider Note  Time seen: 6:48 PM  I have reviewed the triage vital signs and the nursing notes.   HISTORY  Chief Complaint Emesis and Diarrhea    HPI Vanessa Booker is a 21 y.o. female who presents to the emergency department for nausea vomiting and diarrhea. Patient is here with her niece and nephew as well as fiance all developed symptoms of nausea vomiting and diarrhea within 1 hour of each other. They all ate the same food last including cube steak, potatoes and birthday cake. Patient states she woke up around 5 to 6:00 this morning with nausea vomiting and diarrhea has continued throughout the day today. States mild diffuse abdominal cramping. Denies any dysuria vaginal bleeding or discharge. Denies any cough or congestion.  Past Medical History:  Diagnosis Date  . Asthma   . Headaches due to old head injury   . History of PFTs sept 2013   normal   . Prediabetes   . Reflux     Patient Active Problem List   Diagnosis Date Noted  . Morbid obesity (HCC) 08/01/2016  . Left arm pain 04/15/2016  . Adjustment disorder with mixed anxiety and depressed mood 09/13/2013  . Folliculitis 07/27/2013  . Hidradenitis suppurativa 10/01/2012  . Prediabetes 09/21/2012    No past surgical history on file.  Prior to Admission medications   Medication Sig Start Date End Date Taking? Authorizing Provider  albuterol (PROVENTIL HFA;VENTOLIN HFA) 108 (90 Base) MCG/ACT inhaler Inhale 2 puffs into the lungs every 4 (four) hours as needed for wheezing or shortness of breath. 09/21/15   Cuthriell, Delorise RoyalsJonathan D, PA-C  ALPRAZolam Prudy Feeler(XANAX) 0.25 MG tablet Take 1 tablet (0.25 mg total) by mouth 2 (two) times daily as needed for anxiety. 03/27/16   Babs SciaraLuking, Scott A, MD  ibuprofen (ADVIL,MOTRIN) 800 MG tablet Take 1 tablet (800 mg total) by mouth every 8 (eight) hours as needed. 11/29/15   Emily FilbertWilliams, Jonathan E, MD  phentermine (ADIPEX-P) 37.5 MG tablet  Take 1 tablet (37.5 mg total) by mouth daily before breakfast. 12/19/16   Campbell RichesHoskins, Carolyn C, NP    Allergies  Allergen Reactions  . Celexa [Citalopram Hydrobromide]     Suicidal ideation  . Metformin And Related Hives  . Cefazolin Rash    Welps  . Cefzil [Cefprozil] Rash    Family History  Problem Relation Age of Onset  . Diabetes Mother   . Hypertension Mother   . Depression Mother   . Kidney disease Mother   . Anxiety disorder Mother   . Cancer Father        passed away from colon cancer  . Cancer Maternal Aunt   . Anxiety disorder Maternal Aunt   . Depression Maternal Aunt   . Diabetes Maternal Grandfather   . Hypertension Maternal Grandfather   . Anxiety disorder Maternal Grandfather   . Depression Maternal Grandfather     Social History Social History  Substance Use Topics  . Smoking status: Never Smoker  . Smokeless tobacco: Never Used  . Alcohol use No    Review of Systems Constitutional: Negative for fever. Cardiovascular: Negative for chest pain. Respiratory: Negative for shortness of breath. Gastrointestinal: Abdominal cramping. Positive for nausea vomiting and diarrhea Genitourinary: Negative for dysuria. Negative for bleeding or discharge Musculoskeletal: Negative for back pain. Neurological: Negative for headache All other ROS negative  ____________________________________________   PHYSICAL EXAM:  VITAL SIGNS: ED Triage Vitals  Enc Vitals Group     BP  01/29/17 1725 129/76     Pulse Rate 01/29/17 1725 (!) 113     Resp 01/29/17 1725 20     Temp 01/29/17 1725 98.9 F (37.2 C)     Temp Source 01/29/17 1725 Oral     SpO2 01/29/17 1725 99 %     Weight 01/29/17 1726 221 lb (100.2 kg)     Height 01/29/17 1726 5\' 6"  (1.676 m)     Head Circumference --      Peak Flow --      Pain Score 01/29/17 1725 6     Pain Loc --      Pain Edu? --      Excl. in GC? --     Constitutional: Alert and oriented. Well appearing and in no distress. Eyes:  Normal exam ENT   Head: Normocephalic and atraumatic   Mouth/Throat: Mucous membranes are moist. Cardiovascular: Normal rate, regular rhythm. No murmur Respiratory: Normal respiratory effort without tachypnea nor retractions. Breath sounds are clear  Gastrointestinal: Soft, slight diffuse tenderness, no focal tenderness identified. No rebound or guarding. No distention. No CVA tenderness. Musculoskeletal: Nontender with normal range of motion in all extremities.  Neurologic:  Normal speech and language. No gross focal neurologic deficits Skin:  Skin is warm, dry and intact.  Psychiatric: Mood and affect are normal.  ____________________________________________   INITIAL IMPRESSION / ASSESSMENT AND PLAN / ED COURSE  Pertinent labs & imaging results that were available during my care of the patient were reviewed by me and considered in my medical decision making (see chart for details).  Patient presents the emergency department for nausea vomiting and diarrhea, symptoms started within 1 hour of the other family members. Highly suspect food poisoning versus gastroenteritis. We will dose Zofran and loperamide in the emergency department. I discussed supportive care at home including fluids, Zofran as needed, and plenty of rest.  ____________________________________________   FINAL CLINICAL IMPRESSION(S) / ED DIAGNOSES  Nausea vomiting diarrhea    Minna AntisPaduchowski, Mitul Hallowell, MD 01/29/17 1850

## 2017-01-29 NOTE — ED Notes (Signed)
Pt unable to void at this time. 

## 2017-01-29 NOTE — ED Notes (Signed)
Pt is legal guardian of other pt's in room with her. She states that last night was celebrating birthday of child she is guardian of. They ate a home cooked meal and birthday cake from [employer]. Believe it is food poisoning. Alert, oriented, wheeled back to room. Symptoms are N&V&D. Denies blood in vomit and diarrhea.

## 2017-04-03 ENCOUNTER — Other Ambulatory Visit: Payer: Self-pay | Admitting: Family Medicine

## 2017-05-16 ENCOUNTER — Encounter: Payer: Self-pay | Admitting: Emergency Medicine

## 2017-05-16 ENCOUNTER — Emergency Department: Payer: Self-pay

## 2017-05-16 ENCOUNTER — Emergency Department
Admission: EM | Admit: 2017-05-16 | Discharge: 2017-05-16 | Disposition: A | Discharge status: Home / Self Care | Payer: Self-pay | Attending: Emergency Medicine | Admitting: Emergency Medicine

## 2017-05-16 ENCOUNTER — Other Ambulatory Visit: Payer: Self-pay

## 2017-05-16 DIAGNOSIS — Z79899 Other long term (current) drug therapy: Secondary | ICD-10-CM | POA: Insufficient documentation

## 2017-05-16 DIAGNOSIS — R1030 Lower abdominal pain, unspecified: Secondary | ICD-10-CM | POA: Insufficient documentation

## 2017-05-16 DIAGNOSIS — F17210 Nicotine dependence, cigarettes, uncomplicated: Secondary | ICD-10-CM | POA: Insufficient documentation

## 2017-05-16 DIAGNOSIS — K5900 Constipation, unspecified: Secondary | ICD-10-CM | POA: Insufficient documentation

## 2017-05-16 DIAGNOSIS — J45909 Unspecified asthma, uncomplicated: Secondary | ICD-10-CM | POA: Insufficient documentation

## 2017-05-16 LAB — COMPREHENSIVE METABOLIC PANEL
ALK PHOS: 58 U/L (ref 38–126)
ALT: 24 U/L (ref 14–54)
AST: 20 U/L (ref 15–41)
Albumin: 3.6 g/dL (ref 3.5–5.0)
Anion gap: 7 (ref 5–15)
BUN: 19 mg/dL (ref 6–20)
CALCIUM: 9.1 mg/dL (ref 8.9–10.3)
CHLORIDE: 108 mmol/L (ref 101–111)
CO2: 24 mmol/L (ref 22–32)
Creatinine, Ser: 0.56 mg/dL (ref 0.44–1.00)
GFR calc non Af Amer: >60 mL/min (ref >60)
GLUCOSE: 116 mg/dL — AB (ref 65–99)
Potassium: 4.2 mmol/L (ref 3.5–5.1)
Sodium: 139 mmol/L (ref 135–145)
TOTAL PROTEIN: 7.5 g/dL (ref 6.5–8.1)
Total Bilirubin: 0.8 mg/dL (ref 0.3–1.2)

## 2017-05-16 LAB — CBC
HCT: 39.7 % (ref 35.0–47.0)
Hemoglobin: 13.1 g/dL (ref 12.0–16.0)
MCH: 28.8 pg (ref 26.0–34.0)
MCHC: 33.1 g/dL (ref 32.0–36.0)
MCV: 87 fL (ref 80.0–100.0)
PLATELETS: 273 10*3/uL (ref 150–440)
RBC: 4.56 MIL/uL (ref 3.80–5.20)
RDW: 13.4 % (ref 11.5–14.5)
WBC: 8.9 10*3/uL (ref 3.6–11.0)

## 2017-05-16 LAB — URINALYSIS, COMPLETE (UACMP) WITH MICROSCOPIC
BILIRUBIN URINE: NEGATIVE
Bacteria, UA: NONE SEEN
GLUCOSE, UA: NEGATIVE mg/dL
HGB URINE DIPSTICK: NEGATIVE
Ketones, ur: NEGATIVE mg/dL
NITRITE: NEGATIVE
PH: 6 (ref 5.0–8.0)
Protein, ur: NEGATIVE mg/dL
SPECIFIC GRAVITY, URINE: 1.023 (ref 1.005–1.030)

## 2017-05-16 LAB — PREGNANCY, URINE: PREG TEST UR: NEGATIVE

## 2017-05-16 LAB — LIPASE, BLOOD: Lipase: 37 U/L (ref 11–51)

## 2017-05-16 LAB — POCT PREGNANCY, URINE: Preg Test, Ur: NEGATIVE

## 2017-05-16 MED ORDER — MAGNESIUM CITRATE PO SOLN
1.0000 | Freq: Once | ORAL | Status: AC
  Administered 2017-05-16: 1 via ORAL
  Filled 2017-05-16: qty 296

## 2017-05-16 MED ORDER — DOCUSATE SODIUM 100 MG PO CAPS: 100.0000 mg | ORAL_CAPSULE | Freq: Every day | ORAL | 2 refills | Status: AC | PRN

## 2017-05-16 MED ORDER — MAGNESIUM CITRATE PO SOLN: 1.0000 | Freq: Once | ORAL | 1 refills | Status: AC

## 2017-05-16 MED ORDER — IOPAMIDOL (ISOVUE-300) INJECTION 61%
30.0000 mL | Freq: Once | INTRAVENOUS | Status: AC | PRN
  Administered 2017-05-16: 30 mL via ORAL

## 2017-05-16 MED ORDER — ONDANSETRON HCL 4 MG/2ML IJ SOLN
4.0000 mg | Freq: Once | INTRAMUSCULAR | Status: AC
  Administered 2017-05-16: 4 mg via INTRAVENOUS
  Filled 2017-05-16: qty 2

## 2017-05-16 MED ORDER — SODIUM CHLORIDE 0.9 % IV BOLUS (SEPSIS)
1000.0000 mL | Freq: Once | INTRAVENOUS | Status: AC
  Administered 2017-05-16: 1000 mL via INTRAVENOUS

## 2017-05-16 MED ORDER — DOCUSATE SODIUM 50 MG/5ML PO LIQD
100.0000 mg | Freq: Once | ORAL | Status: AC
  Administered 2017-05-16: 100 mg via ORAL
  Filled 2017-05-16: qty 10

## 2017-05-16 MED ORDER — SORBITOL 70 % SOLN
960.0000 mL | TOPICAL_OIL | Freq: Once | ORAL | Status: DC
  Filled 2017-05-16: qty 473

## 2017-05-16 MED ORDER — IOPAMIDOL (ISOVUE-300) INJECTION 61%
100.0000 mL | Freq: Once | INTRAVENOUS | Status: AC | PRN
  Administered 2017-05-16: 100 mL via INTRAVENOUS

## 2017-05-16 NOTE — ED Notes (Signed)
Pt still working on contrast. Reminded we needed a urine sample

## 2017-05-16 NOTE — ED Provider Notes (Signed)
[facility] Emergency Department Provider Note   ____________________________________________   First MD Initiated Contact with Patient 05/16/17 [phone]     (approximate)  I have reviewed the triage vital signs and the nursing notes.   HISTORY  Chief Complaint Abdominal Pain    HPI Vanessa Booker is a 21 y.o. female who comes into the hospital today with some severe abdominal pain.  She reports that she has not had a bowel movement in a while.  She states that every little bit she will have a sharp pain in her abdomen.  The pain is been severe since yesterday but she has had some pain for the past 4-6 days since her last bowel movement.  The patient states that she has been taking laxatives and drink some magnesium citrate but has not gone to the bathroom.  The patient endorses some nausea with no vomiting.  She has had no fevers.  She has some lower abdominal pain rates her pain a 6 out of 10 in intensity.  She reports that her pain is sharp.  She has not taken anything for her pain.  The patient's last menstrual period was 10/21.   Past Medical History:  Diagnosis Date  . Asthma   . Headaches due to old head injury   . History of PFTs sept 2013   normal   . Prediabetes   . Reflux     Patient Active Problem List   Diagnosis Date Noted  . Morbid obesity (HCC) 08/01/2016  . Left arm pain 04/15/2016  . Adjustment disorder with mixed anxiety and depressed mood 09/13/2013  . Folliculitis 07/27/2013  . Hidradenitis suppurativa 10/01/2012  . Prediabetes 09/21/2012    History reviewed. No pertinent surgical history.  Prior to Admission medications   Medication Sig Start Date End Date Taking? Authorizing Provider  albuterol (PROVENTIL HFA;VENTOLIN HFA) 108 (90 Base) MCG/ACT inhaler Inhale 2 puffs into the lungs every 4 (four) hours as needed for wheezing or shortness of breath. 09/21/15   Cuthriell, Delorise Royals, PA-C  ALPRAZolam Prudy Feeler) 0.25 MG tablet Take  1 tablet (0.25 mg total) by mouth 2 (two) times daily as needed for anxiety. 03/27/16   Babs Sciara, MD  ibuprofen (ADVIL,MOTRIN) 800 MG tablet Take 1 tablet (800 mg total) by mouth every 8 (eight) hours as needed. 11/29/15   Emily Filbert, MD  ondansetron (ZOFRAN ODT) 4 MG disintegrating tablet Take 1 tablet (4 mg total) by mouth every 8 (eight) hours as needed for nausea or vomiting. 01/29/17   Minna Antis, MD  phentermine (ADIPEX-P) 37.5 MG tablet Take 1 tablet (37.5 mg total) by mouth daily before breakfast. 12/19/16   Campbell Riches, NP    Allergies Celexa [citalopram hydrobromide]; Metformin and related; Cefazolin; and Cefzil [cefprozil]  Family History  Problem Relation Age of Onset  . Diabetes Mother   . Hypertension Mother   . Depression Mother   . Kidney disease Mother   . Anxiety disorder Mother   . Cancer Father        passed away from colon cancer  . Cancer Maternal Aunt   . Anxiety disorder Maternal Aunt   . Depression Maternal Aunt   . Diabetes Maternal Grandfather   . Hypertension Maternal Grandfather   . Anxiety disorder Maternal Grandfather   . Depression Maternal Grandfather     Social History Social History   Tobacco Use  . Smoking status: Current Every Day Smoker    Types: Cigarettes  .  Smokeless tobacco: Current User    Types: Snuff  Substance Use Topics  . Alcohol use: No    Alcohol/week: 0.0 oz  . Drug use: No    Review of Systems  Constitutional: No fever/chills Eyes: No visual changes. ENT: No sore throat. Cardiovascular: Denies chest pain. Respiratory: Denies shortness of breath. Gastrointestinal:  abdominal pain, Nausea, constipation. Genitourinary: Negative for dysuria. Musculoskeletal: Negative for back pain. Skin: Negative for rash. Neurological: Negative for headaches, focal weakness or numbness.   ____________________________________________   PHYSICAL EXAM:  VITAL SIGNS: ED Triage Vitals  Enc Vitals Group      BP 05/16/17 0518 114/65     Pulse Rate 05/16/17 0518 83     Resp 05/16/17 0518 18     Temp 05/16/17 0518 97.7 F (36.5 C)     Temp Source 05/16/17 0518 Oral     SpO2 05/16/17 0518 96 %     Weight 05/16/17 0518 213 lb (96.6 kg)     Height 05/16/17 0518 5\' 6"  (1.676 m)     Head Circumference --      Peak Flow --      Pain Score 05/16/17 0517 6     Pain Loc --      Pain Edu? --      Excl. in GC? --     Constitutional: Alert and oriented. Well appearing and in moderate distress. Eyes: Conjunctivae are normal. PERRL. EOMI. Head: Atraumatic. Nose: No congestion/rhinnorhea. Mouth/Throat: Mucous membranes are moist.  Oropharynx non-erythematous. Cardiovascular: Normal rate, regular rhythm. Grossly normal heart sounds.  Good peripheral circulation. Respiratory: Normal respiratory effort.  No retractions. Lungs CTAB. Gastrointestinal: Soft with some lower abd tenderness to palpation. No distention. Positive bowel sounds Musculoskeletal: No lower extremity tenderness nor edema.   Neurologic:  Normal speech and language.  Skin:  Skin is warm, dry and intact.  Psychiatric: Mood and affect are normal.   ____________________________________________   LABS (all labs ordered are listed, but only abnormal results are displayed)  Labs Reviewed  COMPREHENSIVE METABOLIC PANEL - Abnormal; Notable for the following components:      Result Value   Glucose, Bld 116 (*)    All other components within normal limits  LIPASE, BLOOD  CBC  URINALYSIS, COMPLETE (UACMP) WITH MICROSCOPIC  PREGNANCY, URINE   ____________________________________________  EKG  none ____________________________________________  RADIOLOGY  No results found.  ____________________________________________   PROCEDURES  Procedure(s) performed: None  Procedures  Critical Care performed: No  ____________________________________________   INITIAL IMPRESSION / ASSESSMENT AND PLAN / ED COURSE  As part of  my medical decision making, I reviewed the following data within the electronic MEDICAL RECORD NUMBER Notes from prior ED visits and [place] Controlled Substance Database   This is a 21 year old female who comes in with some severe lower abdominal pain.  The patient's pain is worse in her left lower quadrant.  We will send the patient for a CT scan looking for possible cause of her pain.  The patient states that she has been constipated but she does not regularly have problems with constipation.  I will give the patient a liter of normal saline as well as some Zofran.  She will be reassessed once I receive her results as well as her CT scan. The patient declined the pelvic exam at this time     The patient's care will be signed out to Dr Alphonzo LemmingsMcShane for further eval ____________________________________________   FINAL CLINICAL IMPRESSION(S) / ED DIAGNOSES  Final diagnoses:  Lower  abdominal pain     ED Discharge Orders    None       Note:  This document was prepared using Dragon voice recognition software and may include unintentional dictation errors.    Rebecka ApleyWebster, Jorie Zee P, MD 05/16/17 [phone]

## 2017-05-16 NOTE — ED Provider Notes (Addendum)
-----------------------------------------   9:59 AM on 05/16/2017 -----------------------------------------  Patient resting comfortably in the bed, CT scan shows extensive and significant constipation I have counseled the patient extensively about diet, the need for fiber etc.  Given how much stool is in there, we will give a enema here.  Patient would prefer to defer manual disimpaction at this time.  ----------------------------------------- 11:52 AM on 05/16/2017 -----------------------------------------  Patient did elect to try manual disimpaction  Procedure: Manual disimpaction female nurse chaperone present, I was able to extract a small amount of soft bowel movement, using my index finger and lubrication.  However, there was no obvious impaction at the rectal verge which was amenable to   Removal  ----------------------------------------- 12:13 PM on 05/16/2017 -----------------------------------------  Patient had a very large and satisfying bowel movement after enema we will discharge with close outpatient follow-up return precautions and instructions   Jeanmarie PlantMcShane, Darrian Goodwill A, MD 05/16/17 1000    Jeanmarie PlantMcShane, Marky Buresh A, MD 05/16/17 1213    Jeanmarie PlantMcShane, Kiaan Overholser A, MD 05/27/17 [phone]

## 2017-05-16 NOTE — ED Triage Notes (Signed)
Pt presents to ED with c/o generalized abd pain that radiates around to her back and bottom. Pt states she thinks she is constipated. Pt states she is unable to have a bowel movement and cant remember when she last had one. Pt reports it is uncomfortable for her to sit down.  +nasuea.

## 2017-08-08 ENCOUNTER — Other Ambulatory Visit: Payer: Self-pay

## 2017-08-08 ENCOUNTER — Emergency Department
Admission: EM | Admit: 2017-08-08 | Discharge: 2017-08-08 | Disposition: A | Discharge status: Home / Self Care | Payer: PRIVATE HEALTH INSURANCE | Attending: Emergency Medicine | Admitting: Emergency Medicine

## 2017-08-08 ENCOUNTER — Encounter: Payer: Self-pay | Admitting: Emergency Medicine

## 2017-08-08 DIAGNOSIS — G43809 Other migraine, not intractable, without status migrainosus: Secondary | ICD-10-CM | POA: Insufficient documentation

## 2017-08-08 DIAGNOSIS — F4323 Adjustment disorder with mixed anxiety and depressed mood: Secondary | ICD-10-CM | POA: Insufficient documentation

## 2017-08-08 DIAGNOSIS — J45909 Unspecified asthma, uncomplicated: Secondary | ICD-10-CM | POA: Insufficient documentation

## 2017-08-08 MED ORDER — METOCLOPRAMIDE HCL 5 MG/ML IJ SOLN
10.0000 mg | Freq: Once | INTRAMUSCULAR | Status: AC
  Administered 2017-08-08: 10 mg via INTRAVENOUS
  Filled 2017-08-08: qty 2

## 2017-08-08 MED ORDER — KETOROLAC TROMETHAMINE 30 MG/ML IJ SOLN
30.0000 mg | Freq: Once | INTRAMUSCULAR | Status: AC
  Administered 2017-08-08: 30 mg via INTRAVENOUS
  Filled 2017-08-08: qty 1

## 2017-08-08 MED ORDER — DIPHENHYDRAMINE HCL 50 MG/ML IJ SOLN
25.0000 mg | Freq: Once | INTRAMUSCULAR | Status: AC
  Administered 2017-08-08: 25 mg via INTRAVENOUS
  Filled 2017-08-08: qty 1

## 2017-08-08 NOTE — ED Triage Notes (Signed)
Pt to ED via POV with c/o headache, hx of migraines. Pt sensitive to light and sound. VS stable, pt A&OX4, ambulatory to triage

## 2017-08-08 NOTE — ED Provider Notes (Signed)
[facility] Emergency Department Provider Note   ____________________________________________    I have reviewed the triage vital signs and the nursing notes.   HISTORY  Chief Complaint Headache     HPI Vanessa Booker is a 22 y.o. female who presents with complaints of migraine headache.  Patient reports a history of migraine headaches and reports 5-6 hours ago she developed a global throbbing headache consistent with her usual migraines.  She reports that she can tell that this is going to "be a bad one" if she does not get treatment.  In the past she has had significant improvement from ED treatment of headaches.  Denies neuro deficits.  No nausea or vomiting.  Has not taken anything for this.  No fevers or chills or neck pain.  Past Medical History:  Diagnosis Date  . Asthma   . Headaches due to old head injury   . History of PFTs sept 2013   normal   . Prediabetes   . Reflux     Patient Active Problem List   Diagnosis Date Noted  . Morbid obesity (HCC) 08/01/2016  . Left arm pain 04/15/2016  . Adjustment disorder with mixed anxiety and depressed mood 09/13/2013  . Folliculitis 07/27/2013  . Hidradenitis suppurativa 10/01/2012  . Prediabetes 09/21/2012    History reviewed. No pertinent surgical history.  Prior to Admission medications   Medication Sig Start Date End Date Taking? Authorizing Provider  albuterol (PROVENTIL HFA;VENTOLIN HFA) 108 (90 Base) MCG/ACT inhaler Inhale 2 puffs into the lungs every 4 (four) hours as needed for wheezing or shortness of breath. Patient not taking: Reported on 05/16/2017 09/21/15   Cuthriell, Delorise Royals, PA-C  ALPRAZolam Prudy Feeler) 0.25 MG tablet Take 1 tablet (0.25 mg total) by mouth 2 (two) times daily as needed for anxiety. Patient not taking: Reported on 05/16/2017 03/27/16   Babs Sciara, MD  docusate sodium (COLACE) 100 MG capsule Take 1 capsule (100 mg total) daily as needed by mouth. 05/16/17  05/16/18  Jeanmarie Plant, MD  ibuprofen (ADVIL,MOTRIN) 800 MG tablet Take 1 tablet (800 mg total) by mouth every 8 (eight) hours as needed. 11/29/15   Emily Filbert, MD  ondansetron (ZOFRAN ODT) 4 MG disintegrating tablet Take 1 tablet (4 mg total) by mouth every 8 (eight) hours as needed for nausea or vomiting. Patient not taking: Reported on 05/16/2017 01/29/17   Minna Antis, MD  phentermine (ADIPEX-P) 37.5 MG tablet Take 1 tablet (37.5 mg total) by mouth daily before breakfast. Patient not taking: Reported on 05/16/2017 12/19/16   Campbell Riches, NP     Allergies Celexa [citalopram hydrobromide]; Metformin and related; Cefazolin; and Cefzil [cefprozil]  Family History  Problem Relation Age of Onset  . Diabetes Mother   . Hypertension Mother   . Depression Mother   . Kidney disease Mother   . Anxiety disorder Mother   . Cancer Father        passed away from colon cancer  . Cancer Maternal Aunt   . Anxiety disorder Maternal Aunt   . Depression Maternal Aunt   . Diabetes Maternal Grandfather   . Hypertension Maternal Grandfather   . Anxiety disorder Maternal Grandfather   . Depression Maternal Grandfather     Social History Social History   Tobacco Use  . Smoking status: Never Smoker  . Smokeless tobacco: Never Used  Substance Use Topics  . Alcohol use: No    Alcohol/week: 0.0 oz  . Drug use:  No    Review of Systems  Constitutional: No fever/chills Eyes: No visual changes.  ENT: No pain Cardiovascular: Denies chest pain. Respiratory: Denies shortness of breath. Gastrointestinal:  No nausea, no vomiting.   Genitourinary: Negative for dysuria. Musculoskeletal: Negative for back pain. Skin: Negative for rash. Neurological: N headache as above, no weakness   ____________________________________________   PHYSICAL EXAM:  VITAL SIGNS: ED Triage Vitals  Enc Vitals Group     BP 08/08/17 1800 120/70     Pulse Rate 08/08/17 1800 79     Resp  08/08/17 1800 16     Temp 08/08/17 1800 99 F (37.2 C)     Temp Source 08/08/17 1800 Oral     SpO2 08/08/17 1800 99 %     Weight 08/08/17 1801 103 kg (227 lb)     Height 08/08/17 1801 1.676 m (5\' 6" )     Head Circumference --      Peak Flow --      Pain Score 08/08/17 1801 7     Pain Loc --      Pain Edu? --      Excl. in GC? --     Constitutional: Alert and oriented. No acute distress.  Eyes: Conjunctivae are normal.   Nose: No congestion/rhinnorhea. Mouth/Throat: Mucous membranes are moist.    Cardiovascular: Normal rate, regular rhythm.  Good peripheral circulation. Respiratory: Normal respiratory effort.  No retractions. Gastrointestinal: Soft and nontender. No distention.  No CVA tenderness. Genitourinary: deferred Musculoskeletal: .  Warm and well perfused Neurologic:  Normal speech and language. No gross focal neurologic deficits are appreciated.  Skin:  Skin is warm, dry and intact. No rash noted. Psychiatric: Mood and affect are normal. Speech and behavior are normal.  ____________________________________________   LABS (all labs ordered are listed, but only abnormal results are displayed)  Labs Reviewed - No data to display ____________________________________________  EKG  None ____________________________________________  RADIOLOGY  None ____________________________________________   PROCEDURES  Procedure(s) performed: No  Procedures   Critical Care performed: No ____________________________________________   INITIAL IMPRESSION / ASSESSMENT AND PLAN / ED COURSE  Pertinent labs & imaging results that were available during my care of the patient were reviewed by me and considered in my medical decision making (see chart for details).  Patient well-appearing in no acute distress.  No concerning findings on exam, this appears to be consistent with typical migraine symptoms.  We will treat with IV Reglan, Benadryl, Toradol, patient reports  recent menstruation at usual time    ____________________________________________   FINAL CLINICAL IMPRESSION(S) / ED DIAGNOSES  Final diagnoses:  Other migraine without status migrainosus, not intractable        Note:  This document was prepared using Dragon voice recognition software and may include unintentional dictation errors.    Jene EveryKinner, Lanea Vankirk, MD 08/08/17 2110

## 2017-09-25 NOTE — Telephone Encounter (Signed)
No longer our patient please refuse

## 2017-11-18 ENCOUNTER — Emergency Department
Admission: EM | Admit: 2017-11-18 | Discharge: 2017-11-18 | Disposition: A | Discharge status: Home / Self Care | Payer: Medicaid Other | Attending: Emergency Medicine | Admitting: Emergency Medicine

## 2017-11-18 ENCOUNTER — Encounter: Payer: Self-pay | Admitting: Emergency Medicine

## 2017-11-18 DIAGNOSIS — J45909 Unspecified asthma, uncomplicated: Secondary | ICD-10-CM | POA: Insufficient documentation

## 2017-11-18 DIAGNOSIS — G43001 Migraine without aura, not intractable, with status migrainosus: Secondary | ICD-10-CM

## 2017-11-18 MED ORDER — SODIUM CHLORIDE 0.9 % IV BOLUS
1000.0000 mL | Freq: Once | INTRAVENOUS | Status: AC
  Administered 2017-11-18: 1000 mL via INTRAVENOUS

## 2017-11-18 MED ORDER — KETOROLAC TROMETHAMINE 30 MG/ML IJ SOLN
30.0000 mg | Freq: Once | INTRAMUSCULAR | Status: AC
  Administered 2017-11-18: 30 mg via INTRAVENOUS
  Filled 2017-11-18: qty 1

## 2017-11-18 MED ORDER — ONDANSETRON HCL 4 MG/2ML IJ SOLN
4.0000 mg | Freq: Once | INTRAMUSCULAR | Status: AC
  Administered 2017-11-18: 4 mg via INTRAVENOUS
  Filled 2017-11-18: qty 2

## 2017-11-18 NOTE — ED Triage Notes (Signed)
Patient states she woke up with a migraine.  She reports history of migraines and states this feels the same.  Patient denies vomiting this morning.  Patient states, "what normally works for me is the migraine cocktail, either the shots of IV, over the counter medicines never work for me."  Patient is in no obvious distress at this time.

## 2017-11-18 NOTE — Discharge Instructions (Addendum)
Follow-up with your regular doctor if not better in 3 to 5 days.  Return emergency department if worsening.  Use over-the-counter Tylenol or ibuprofen for pain as needed.  Please drink plenty of water as this helps prevent migraines

## 2017-11-18 NOTE — ED Provider Notes (Signed)
[facility] Emergency Department Provider Note  ____________________________________________   First MD Initiated Contact with Patient 11/18/17 1138     (approximate)  I have reviewed the triage vital signs and the nursing notes.   HISTORY  Chief Complaint Migraine    HPI Vanessa Booker is a 22 y.o. female presents emergency department complaining of a migraine upon waking.  She states she has a history of migraines and this feels the same.  She denies vomiting.  She states she is sensitive to light.  No noise sensitivity.  She states she has had no relief with over-the-counter medications.  She usually when she comes here that whenever we give her a shot or an IV works.  Past Medical History:  Diagnosis Date  . Asthma   . Headaches due to old head injury   . History of PFTs sept 2013   normal   . Prediabetes   . Reflux     Patient Active Problem List   Diagnosis Date Noted  . Morbid obesity (HCC) 08/01/2016  . Left arm pain 04/15/2016  . Adjustment disorder with mixed anxiety and depressed mood 09/13/2013  . Folliculitis 07/27/2013  . Hidradenitis suppurativa 10/01/2012  . Prediabetes 09/21/2012    History reviewed. No pertinent surgical history.  Prior to Admission medications   Medication Sig Start Date End Date Taking? Authorizing Provider  docusate sodium (COLACE) 100 MG capsule Take 1 capsule (100 mg total) daily as needed by mouth. 05/16/17 05/16/18  Jeanmarie Plant, MD  ibuprofen (ADVIL,MOTRIN) 800 MG tablet Take 1 tablet (800 mg total) by mouth every 8 (eight) hours as needed. 11/29/15   Emily Filbert, MD    Allergies Celexa [citalopram hydrobromide]; Metformin and related; Cefazolin; and Cefzil [cefprozil]  Family History  Problem Relation Age of Onset  . Diabetes Mother   . Hypertension Mother   . Depression Mother   . Kidney disease Mother   . Anxiety disorder Mother   . Cancer Father        passed away from  colon cancer  . Cancer Maternal Aunt   . Anxiety disorder Maternal Aunt   . Depression Maternal Aunt   . Diabetes Maternal Grandfather   . Hypertension Maternal Grandfather   . Anxiety disorder Maternal Grandfather   . Depression Maternal Grandfather     Social History Social History   Tobacco Use  . Smoking status: Never Smoker  . Smokeless tobacco: Never Used  Substance Use Topics  . Alcohol use: No    Alcohol/week: 0.0 oz  . Drug use: No    Review of Systems  Constitutional: No fever/chills, complaints of headache Eyes: No visual changes. ENT: No sore throat. Respiratory: Denies cough Genitourinary: Negative for dysuria. Musculoskeletal: Negative for back pain. Skin: Negative for rash.    ____________________________________________   PHYSICAL EXAM:  VITAL SIGNS: ED Triage Vitals  Enc Vitals Group     BP 11/18/17 1121 117/73     Pulse Rate 11/18/17 1121 80     Resp 11/18/17 1121 18     Temp 11/18/17 1121 97.9 F (36.6 C)     Temp Source 11/18/17 1355 Oral     SpO2 11/18/17 1121 99 %     Weight 11/18/17 1123 226 lb (102.5 kg)     Height 11/18/17 1123  (1.676 m)     Head Circumference --      Peak Flow --      Pain Score 11/18/17 1123 6  Pain Loc --      Pain Edu? --      Excl. in GC? --     Constitutional: Alert and oriented. Well appearing and in no acute distress. Eyes: Conjunctivae are normal.  Head: Atraumatic. Nose: No congestion/rhinnorhea. Mouth/Throat: Mucous membranes are moist.   Cardiovascular: Normal rate, regular rhythm.  Heart sounds are normal Respiratory: Normal respiratory effort.  No retractions, lungs are clear to auscultation GU: deferred Musculoskeletal: FROM all extremities, warm and well perfused Neurologic:  Normal speech and language.  Cranial nerves II through XII are grossly intact Skin:  Skin is warm, dry and intact. No rash noted. Psychiatric: Mood and affect are normal. Speech and behavior are  normal.  ____________________________________________   LABS (all labs ordered are listed, but only abnormal results are displayed)  Labs Reviewed - No data to display ____________________________________________   ____________________________________________  RADIOLOGY    ____________________________________________   PROCEDURES  Procedure(s) performed: Saline lock, normal saline 1 L IV, Zofran 4 mg IV, Toradol 30 mg IV  Procedures    ____________________________________________   INITIAL IMPRESSION / ASSESSMENT AND PLAN / ED COURSE  Pertinent labs & imaging results that were available during my care of the patient were reviewed by me and considered in my medical decision making (see chart for details).  Patient is a 22 year old female presents emergency department complaining of migraine headache.  She has history of migraines.  On physical exam patient appears very well.  Cranial nerves II through XII grossly intact.  The exam findings to the patient.  She was given a saline lock, saline 1 L IV, Zofran 4 mg IV, and Toradol 30 mg IV.  The patient states she did have relief with the medication.  The nursing staff will hold her until the fluids are completed.  Patient was instructed to take over-the-counter medications as needed for the headache.  She is to drink plenty of fluids to prevent migraines.  She states she understands will comply with our instructions.  She was discharged in stable condition     As part of my medical decision making, I reviewed the following data within the electronic MEDICAL RECORD NUMBER Nursing notes reviewed and incorporated, Old chart reviewed, Notes from prior ED visits and [place] Controlled Substance Database  ____________________________________________   FINAL CLINICAL IMPRESSION(S) / ED DIAGNOSES  Final diagnoses:  Migraine without aura and with status migrainosus, not intractable      NEW MEDICATIONS STARTED DURING THIS  VISIT:  Discharge Medication List as of 11/18/2017 12:53 PM       Note:  This document was prepared using Dragon voice recognition software and may include unintentional dictation errors.    Faythe Ghee, PA-C 11/18/17 1700    Phineas Semen, MD 11/19/17 [phone]

## 2018-04-21 IMAGING — CR DG CHEST 2V
2 series · 2 of 2 positions shown · non-contrast
Comparison: Chest x-ray of 06/15/2014

CLINICAL DATA: Chest discomfort today, lightheadedness, swelling,
pre diabetic

EXAM:
CHEST  2 VIEW

[chest pa]
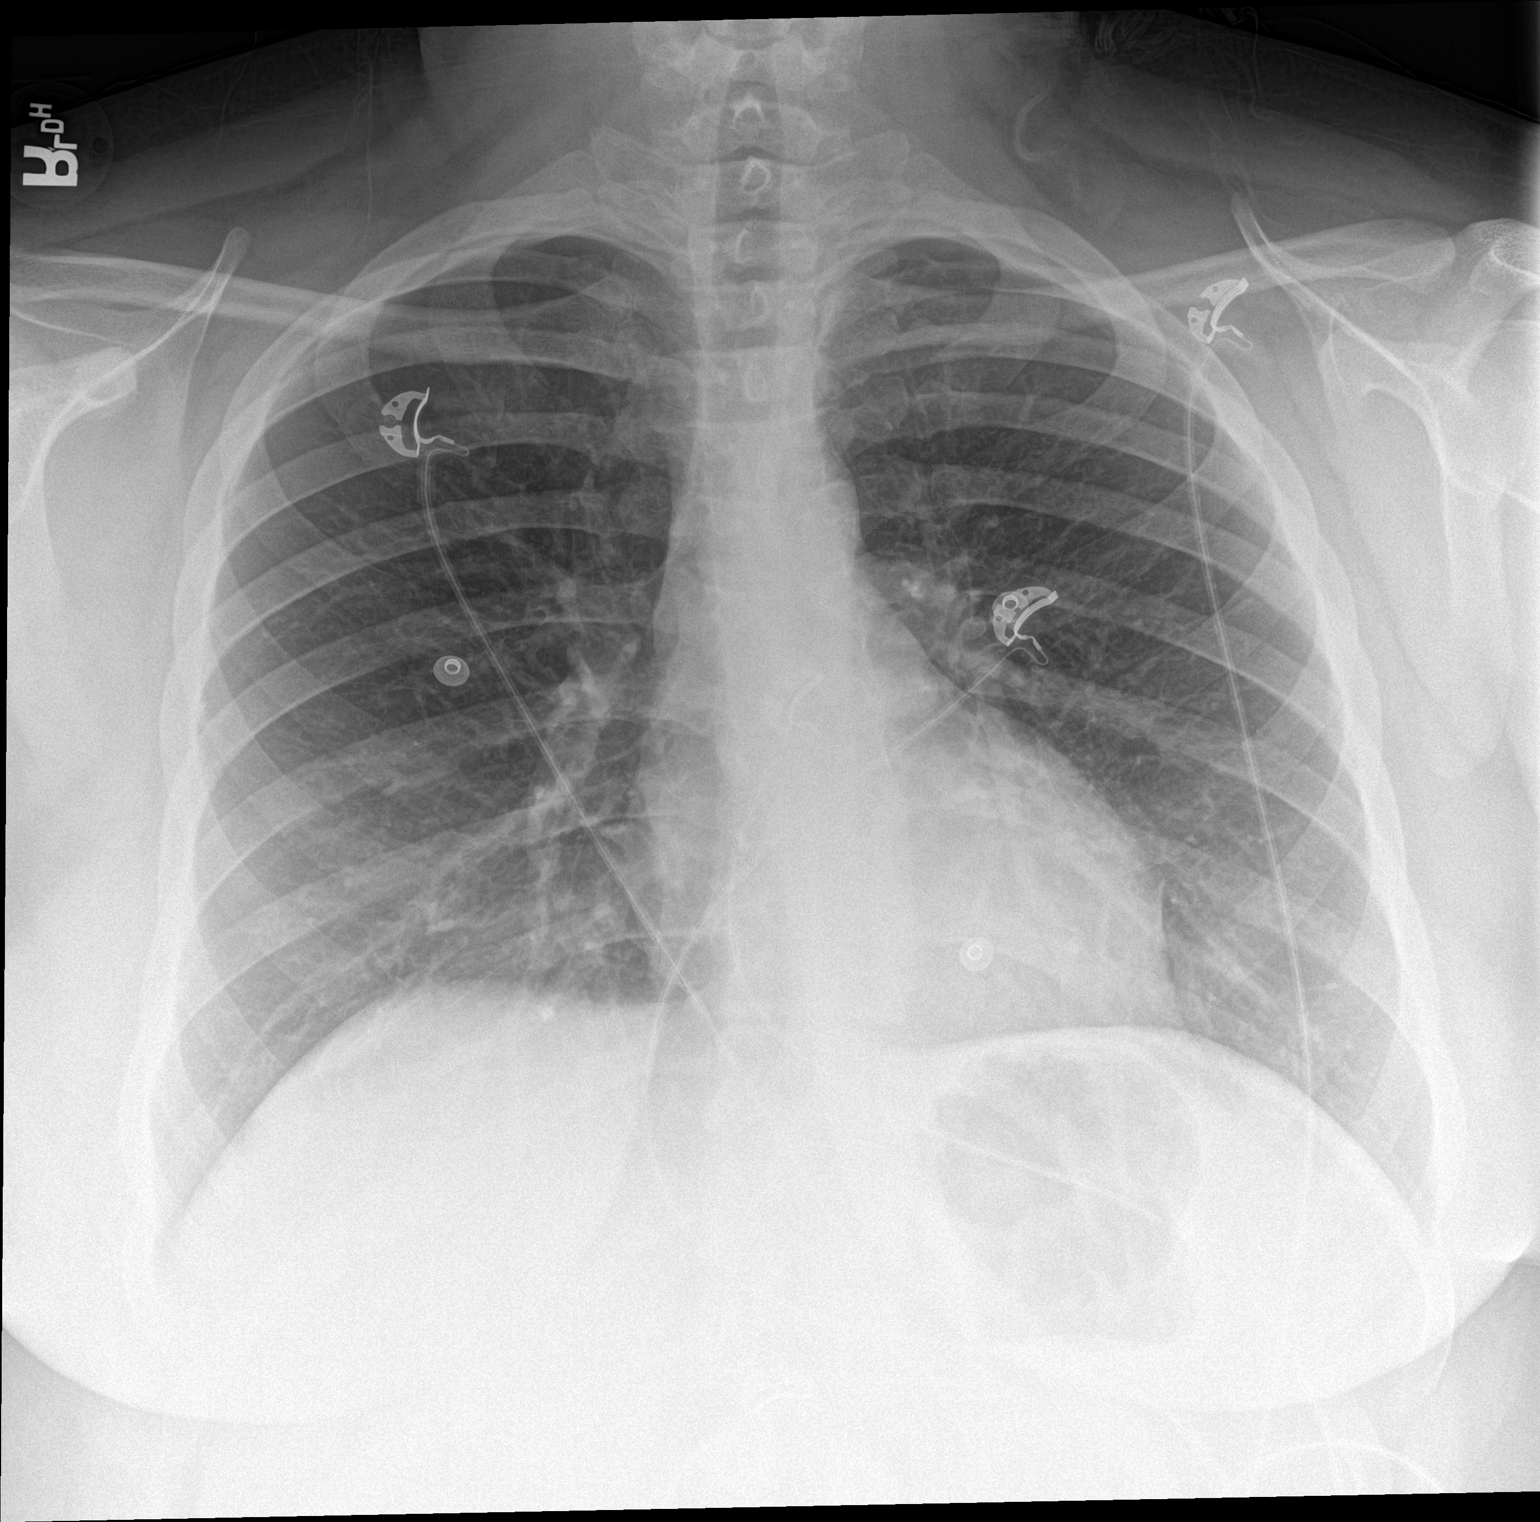

[chest lat]
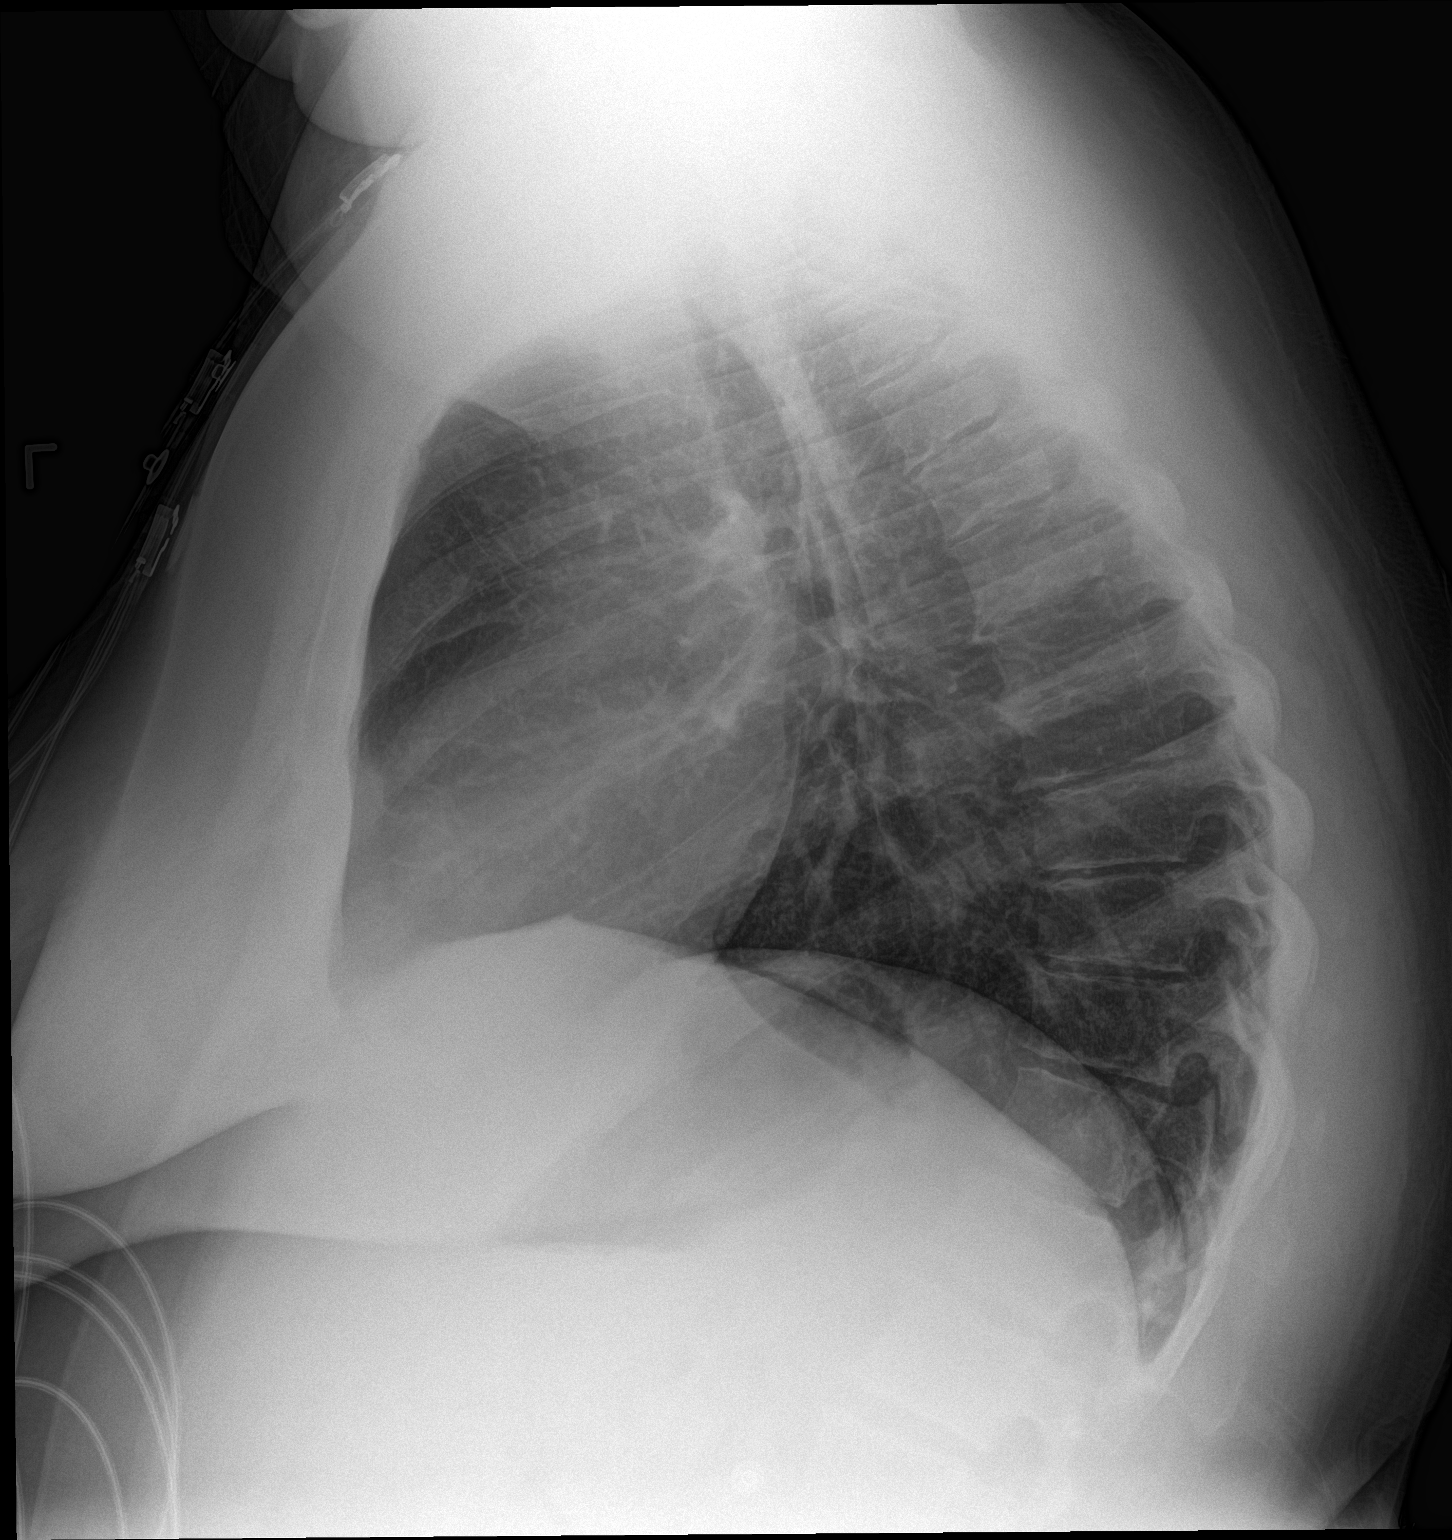

[2 of 2 positions shown; findings below may reference images not displayed]

FINDINGS: No active infiltrate or effusion is seen. Mediastinal and hilar
contours are unremarkable. The heart is within normal limits in
size. No bony abnormality is seen.
IMPRESSION: No active cardiopulmonary disease.

## 2018-08-11 ENCOUNTER — Emergency Department
Admission: EM | Admit: 2018-08-11 | Discharge: 2018-08-11 | Disposition: A | Discharge status: Home / Self Care | Payer: Self-pay | Attending: Student in an Organized Health Care Education/Training Program | Admitting: Student in an Organized Health Care Education/Training Program

## 2018-08-11 ENCOUNTER — Emergency Department: Payer: Self-pay

## 2018-08-11 ENCOUNTER — Encounter: Payer: Self-pay | Admitting: Emergency Medicine

## 2018-08-11 ENCOUNTER — Other Ambulatory Visit: Payer: Self-pay

## 2018-08-11 DIAGNOSIS — G43001 Migraine without aura, not intractable, with status migrainosus: Secondary | ICD-10-CM | POA: Insufficient documentation

## 2018-08-11 DIAGNOSIS — J45909 Unspecified asthma, uncomplicated: Secondary | ICD-10-CM | POA: Insufficient documentation

## 2018-08-11 DIAGNOSIS — F4323 Adjustment disorder with mixed anxiety and depressed mood: Secondary | ICD-10-CM | POA: Insufficient documentation

## 2018-08-11 MED ORDER — ONDANSETRON HCL 4 MG/2ML IJ SOLN
4.0000 mg | Freq: Once | INTRAMUSCULAR | Status: AC
  Administered 2018-08-11: 4 mg via INTRAVENOUS
  Filled 2018-08-11: qty 2

## 2018-08-11 MED ORDER — KETOROLAC TROMETHAMINE 30 MG/ML IJ SOLN
30.0000 mg | Freq: Once | INTRAMUSCULAR | Status: AC
  Administered 2018-08-11: 30 mg via INTRAVENOUS
  Filled 2018-08-11: qty 1

## 2018-08-11 MED ORDER — METOCLOPRAMIDE HCL 5 MG/ML IJ SOLN
20.0000 mg | Freq: Once | INTRAVENOUS | Status: AC
  Administered 2018-08-11: 20 mg via INTRAVENOUS
  Filled 2018-08-11: qty 4

## 2018-08-11 MED ORDER — SODIUM CHLORIDE 0.9 % IV BOLUS
1000.0000 mL | Freq: Once | INTRAVENOUS | Status: AC
  Administered 2018-08-11: 1000 mL via INTRAVENOUS

## 2018-08-11 NOTE — ED Triage Notes (Signed)
Pt states headache for 4 days now, hx of migraines, this is described as pulsating pain, and pain behind left eye. Appears in NAD.

## 2018-08-11 NOTE — ED Provider Notes (Signed)
[facility] Emergency Department Provider Note   ____________________________________________   First MD Initiated Contact with Patient 08/11/18 1021     (approximate)  I have reviewed the triage vital signs and the nursing notes.   HISTORY  Chief Complaint Headache    HPI Vanessa Booker is a 23 y.o. female patient complain of headache for 4 days.  Patient stated she has a history of migraine but this pain is different.  Patient describes the pain as "pulsating".  She said this blurry vision in the left eye.  Patient also state intimating nausea but no vomiting.  Patient rates the pain as a 6/10.  No palliative measure for complaint.    Past Medical History:  Diagnosis Date  . Asthma   . Headaches due to old head injury   . History of PFTs sept 2013   normal   . Prediabetes   . Reflux     Patient Active Problem List   Diagnosis Date Noted  . Morbid obesity (HCC) 08/01/2016  . Left arm pain 04/15/2016  . Adjustment disorder with mixed anxiety and depressed mood 09/13/2013  . Folliculitis 07/27/2013  . Hidradenitis suppurativa 10/01/2012  . Prediabetes 09/21/2012    History reviewed. No pertinent surgical history.  Prior to Admission medications   Medication Sig Start Date End Date Taking? Authorizing Provider  ibuprofen (ADVIL,MOTRIN) 800 MG tablet Take 1 tablet (800 mg total) by mouth every 8 (eight) hours as needed. 11/29/15   Emily Filbert, MD    Allergies Celexa [citalopram hydrobromide]; Metformin and related; Cefazolin; and Cefzil [cefprozil]  Family History  Problem Relation Age of Onset  . Diabetes Mother   . Hypertension Mother   . Depression Mother   . Kidney disease Mother   . Anxiety disorder Mother   . Cancer Father        passed away from colon cancer  . Cancer Maternal Aunt   . Anxiety disorder Maternal Aunt   . Depression Maternal Aunt   . Diabetes Maternal Grandfather   . Hypertension Maternal  Grandfather   . Anxiety disorder Maternal Grandfather   . Depression Maternal Grandfather     Social History Social History   Tobacco Use  . Smoking status: Never Smoker  . Smokeless tobacco: Never Used  Substance Use Topics  . Alcohol use: No    Alcohol/week: 0.0 standard drinks  . Drug use: No    Review of Systems Constitutional: No fever/chills Eyes: No visual changes.  Blurry vision left eye. ENT: No sore throat. Cardiovascular: Denies chest pain. Respiratory: Denies shortness of breath. Gastrointestinal: No abdominal pain.  Nausea without vomiting.  No diarrhea.  No constipation. Genitourinary: Negative for dysuria. Musculoskeletal: Negative for back pain. Skin: Negative for rash. Neurological: Positive for headaches, blurry vision left eye.  Endocrine:  Prediabetic. Hematological/Lymphatic:  Allergic/Immunilogical: See allergy list. ____________________________________________   PHYSICAL EXAM:  VITAL SIGNS: ED Triage Vitals  Enc Vitals Group     BP 08/11/18 0958 115/71     Pulse Rate 08/11/18 0958 79     Resp 08/11/18 0958 18     Temp 08/11/18 0958 98.3 F (36.8 C)     Temp Source 08/11/18 0958 Oral     SpO2 08/11/18 0958 98 %     Weight 08/11/18 0955 220 lb (99.8 kg)     Height 08/11/18 0955 5\' 6"  (1.676 m)     Head Circumference --      Peak Flow --  Pain Score 08/11/18 0955 6     Pain Loc --      Pain Edu? --      Excl. in [place]? --     Constitutional: Alert and oriented. Well appearing and in no acute distress.  Obesity. Eyes: Conjunctivae are normal.  Photophobic. Head: Atraumatic. Nose: No congestion/rhinnorhea. Mouth/Throat: Mucous membranes are moist.  Oropharynx non-erythematous. Neck: No stridor.   Cardiovascular: Normal rate, regular rhythm. Grossly normal heart sounds.  Good peripheral circulation. Respiratory: Normal respiratory effort.  No retractions. Lungs CTAB. Gastrointestinal: Soft and nontender. No distention. No abdominal  bruits. No CVA tenderness. Musculoskeletal: No lower extremity tenderness nor edema.  No joint effusions. Neurologic:  Normal speech and language. No gross focal neurologic deficits are appreciated. No gait instability. Skin:  Skin is warm, dry and intact. No rash noted. Psychiatric: Mood and affect are normal. Speech and behavior are normal.  ____________________________________________   LABS (all labs ordered are listed, but only abnormal results are displayed)  Labs Reviewed - No data to display ____________________________________________  EKG   ____________________________________________  RADIOLOGY  ED MD interpretation:    Official radiology report(s): Ct Head Wo Contrast  Result Date: 08/11/2018 CLINICAL DATA:  Headache for 4 days. EXAM: CT HEAD WITHOUT CONTRAST TECHNIQUE: Contiguous axial images were obtained from the base of the skull through the vertex without intravenous contrast. COMPARISON:  None. FINDINGS: Brain: No evidence of acute infarction, hemorrhage, hydrocephalus, extra-axial collection or mass lesion/mass effect. Vascular: No hyperdense vessel or unexpected calcification. Skull: Intact.  No focal lesion. Sinuses/Orbits: Negative. Other: None. IMPRESSION: Normal head CT. Electronically Signed   By: Drusilla Kanner M.D.   On: 08/11/2018 10:57    ____________________________________________   PROCEDURES  Procedure(s) performed: None  Procedures  Critical Care performed: No  ____________________________________________   INITIAL IMPRESSION / ASSESSMENT AND PLAN / ED COURSE  As part of my medical decision making, I reviewed the following data within the electronic MEDICAL RECORD NUMBER     Patient presents with 4 days of pulsating headache.  Patient has a history of migraine but states this 1 felt different.  Patient had a CT scan which was grossly unremarkable.  Patient has tenderness treatment for migraine headache of IV normal saline, Toradol,  Zofran, and Reglan.  Patient given discharge care instruction follow-up with PCP to consider neurology consult.     ____________________________________________   FINAL CLINICAL IMPRESSION(S) / ED DIAGNOSES  Final diagnoses:  Migraine without aura and with status migrainosus, not intractable     ED Discharge Orders    None       Note:  This document was prepared using Dragon voice recognition software and may include unintentional dictation errors.    Joni Reining, PA-C 08/11/18 1113    Willy Eddy, MD 08/11/18 1214

## 2018-08-11 NOTE — ED Notes (Signed)
See triage note  Presents with a 4 day hx of headache  Describes as pulsating headache  No fever or trauma

## 2019-04-27 ENCOUNTER — Emergency Department
Admission: EM | Admit: 2019-04-27 | Discharge: 2019-04-27 | Disposition: A | Discharge status: Home / Self Care | Payer: Self-pay

## 2019-04-27 ENCOUNTER — Other Ambulatory Visit: Payer: Self-pay

## 2019-04-28 ENCOUNTER — Encounter: Payer: Self-pay | Admitting: Emergency Medicine

## 2019-04-28 ENCOUNTER — Emergency Department
Admission: EM | Admit: 2019-04-28 | Discharge: 2019-04-28 | Disposition: A | Discharge status: Home / Self Care | Payer: Self-pay | Attending: Emergency Medicine | Admitting: Emergency Medicine

## 2019-04-28 DIAGNOSIS — R7303 Prediabetes: Secondary | ICD-10-CM | POA: Insufficient documentation

## 2019-04-28 DIAGNOSIS — T7840XA Allergy, unspecified, initial encounter: Secondary | ICD-10-CM | POA: Insufficient documentation

## 2019-04-28 DIAGNOSIS — J45909 Unspecified asthma, uncomplicated: Secondary | ICD-10-CM | POA: Insufficient documentation

## 2019-04-28 MED ORDER — DIPHENHYDRAMINE HCL 50 MG/ML IJ SOLN
50.0000 mg | Freq: Once | INTRAMUSCULAR | Status: AC
  Administered 2019-04-28: 06:00:00 50 mg via INTRAVENOUS
  Filled 2019-04-28: qty 1

## 2019-04-28 MED ORDER — EPINEPHRINE 0.3 MG/0.3ML IJ SOAJ
0.3000 mg | Freq: Once | INTRAMUSCULAR | Status: AC
  Administered 2019-04-28: 0.3 mg via INTRAMUSCULAR
  Filled 2019-04-28: qty 0.3

## 2019-04-28 MED ORDER — EPINEPHRINE 0.3 MG/0.3ML IJ SOAJ: 0.3000 mg | INTRAMUSCULAR | 1 refills | Status: DC | PRN

## 2019-04-28 MED ORDER — FAMOTIDINE IN NACL 20-0.9 MG/50ML-% IV SOLN
20.0000 mg | Freq: Once | INTRAVENOUS | Status: AC
  Administered 2019-04-28: 20 mg via INTRAVENOUS
  Filled 2019-04-28: qty 50

## 2019-04-28 MED ORDER — PREDNISONE 20 MG PO TABS: 60.0000 mg | ORAL_TABLET | Freq: Every day | ORAL | 0 refills | Status: AC

## 2019-04-28 MED ORDER — METHYLPREDNISOLONE SODIUM SUCC 125 MG IJ SOLR
125.0000 mg | Freq: Once | INTRAMUSCULAR | Status: AC
  Administered 2019-04-28: 06:00:00 125 mg via INTRAVENOUS
  Filled 2019-04-28: qty 2

## 2019-04-28 NOTE — ED Provider Notes (Signed)
Assumed care from Dr. Alfred Levins at 7 AM. Briefly, the patient is a 23 y.o. female with PMHx of  has a past medical history of Asthma, Headaches due to old head injury, History of PFTs (sept 2013), Prediabetes, and Reflux. here with allergic reaction, lip swelling. Unclear trigger. Received epi IM, antihistamines, steroids. Plan to reassess at 9 AM.   Labs Reviewed - No data to display  Course of Care: -Swelling is resolved.  No respiratory distress.  Discharged with EpiPen and steroids.  Return precautions given.      Duffy Bruce, MD 04/28/19 [phone]

## 2019-04-28 NOTE — ED Triage Notes (Signed)
Patient was here earlier this evening for c/o the same: facial swelling. Patient now with significantly more swelling to right side of face/neck. Patient reports some difficulty swallowing now. Took Benadryl after leaving ER earlier and took 50mg  just prior to coming back to ER.

## 2019-04-28 NOTE — ED Provider Notes (Signed)
[facility] Emergency Department Provider Note  ____________________________________________  Time seen: Approximately 5:34 AM  I have reviewed the triage vital signs and the nursing notes.   HISTORY  Chief Complaint Facial Swelling   HPI Vanessa Booker is a 23 y.o. female with history of prediabetes, obesity, asthma, hidradenitis suppurativa who presents for evaluation of facial swelling.  Patient reports 2 days of progressively worsening lip swelling and tongue swelling.  This morning patient started having the sensation that her throat was closing and having difficulty swallowing. She feels like her tongue is bigger than normal. No dental pain.  No difficulty breathing, no hives, no vomiting or diarrhea.  Patient reports that her symptoms started after she used a coworker lip gloss.  She denies any prior history of angioedema or anaphylaxis.  She denies any new medications.  She is not on ACE inhibitors.   Past Medical History:  Diagnosis Date  . Asthma   . Headaches due to old head injury   . History of PFTs sept 2013   normal   . Prediabetes   . Reflux     Patient Active Problem List   Diagnosis Date Noted  . Morbid obesity (HCC) 08/01/2016  . Left arm pain 04/15/2016  . Adjustment disorder with mixed anxiety and depressed mood 09/13/2013  . Folliculitis 07/27/2013  . Hidradenitis suppurativa 10/01/2012  . Prediabetes 09/21/2012    History reviewed. No pertinent surgical history.  Prior to Admission medications   Medication Sig Start Date End Date Taking? Authorizing Provider  EPINEPHrine 0.3 mg/0.3 mL IJ SOAJ injection Inject 0.3 mLs (0.3 mg total) into the muscle as needed for anaphylaxis. 04/28/19   Nita SickleVeronese, Webber, MD  ibuprofen (ADVIL,MOTRIN) 800 MG tablet Take 1 tablet (800 mg total) by mouth every 8 (eight) hours as needed. 11/29/15   Emily FilbertWilliams, Jonathan E, MD  predniSONE (DELTASONE) 20 MG tablet Take 3 tablets (60 mg total) by mouth  daily for 4 days. 04/28/19 05/02/19  Nita SickleVeronese, , MD    Allergies Celexa [citalopram hydrobromide], Metformin and related, Cefazolin, and Cefzil [cefprozil]  Family History  Problem Relation Age of Onset  . Diabetes Mother   . Hypertension Mother   . Depression Mother   . Kidney disease Mother   . Anxiety disorder Mother   . Cancer Father        passed away from colon cancer  . Cancer Maternal Aunt   . Anxiety disorder Maternal Aunt   . Depression Maternal Aunt   . Diabetes Maternal Grandfather   . Hypertension Maternal Grandfather   . Anxiety disorder Maternal Grandfather   . Depression Maternal Grandfather     Social History Social History   Tobacco Use  . Smoking status: Never Smoker  . Smokeless tobacco: Never Used  Substance Use Topics  . Alcohol use: No    Alcohol/week: 0.0 standard drinks  . Drug use: No    Review of Systems  Constitutional: Negative for fever. Eyes: Negative for visual changes. ENT: Negative for sore throat. + angioedema Neck: No neck pain  Cardiovascular: Negative for chest pain. Respiratory: Negative for shortness of breath. Gastrointestinal: Negative for abdominal pain, vomiting or diarrhea. Genitourinary: Negative for dysuria. Musculoskeletal: Negative for back pain. Skin: Negative for rash. Neurological: Negative for headaches, weakness or numbness. Psych: No SI or HI  ____________________________________________   PHYSICAL EXAM:  VITAL SIGNS: ED Triage Vitals  Enc Vitals Group     BP 04/28/19 0506 128/79     Pulse Rate  04/28/19 0506 74     Resp 04/28/19 0506 20     Temp 04/28/19 0506 98 F (36.7 C)     Temp Source 04/28/19 0506 Oral     SpO2 04/28/19 0506 99 %     Weight 04/28/19 0507 237 lb (107.5 kg)     Height 04/28/19 0507 5\' 5"  (1.651 m)     Head Circumference --      Peak Flow --      Pain Score 04/28/19 0506 5     Pain Loc --      Pain Edu? --      Excl. in [place]? --     Constitutional: Alert and  oriented. Well appearing and in no apparent distress. HEENT:      Head: Normocephalic and atraumatic.         Eyes: Conjunctivae are normal. Sclera is non-icteric.       Mouth/Throat: Mucous membranes are moist.  Swelling of upper and lower lip with vesicular lesions on the right upper lip, tongue is normal size, no trismus, floor of the mouth is soft with no induration, no drooling, patient is handling her saliva with no difficulty, uvula is midline with no swelling, no tonsillar hypertrophy or erythema, no stridor      Neck: Supple with no signs of meningismus. Cardiovascular: Regular rate and rhythm. No murmurs, gallops, or rubs. 2+ symmetrical distal pulses are present in all extremities. No JVD. Respiratory: Normal respiratory effort. Lungs are clear to auscultation bilaterally. No wheezes, crackles, or rhonchi.  Musculoskeletal: Nontender with normal range of motion in all extremities. No edema, cyanosis, or erythema of extremities. Neurologic: Normal speech and language. Face is symmetric. Moving all extremities. No gross focal neurologic deficits are appreciated. Skin: Skin is warm, dry and intact. No rash noted. Psychiatric: Mood and affect are normal. Speech and behavior are normal.  ____________________________________________   LABS (all labs ordered are listed, but only abnormal results are displayed)  Labs Reviewed - No data to display ____________________________________________  EKG  none  ____________________________________________  RADIOLOGY  none  ____________________________________________   PROCEDURES  Procedure(s) performed: None Procedures Critical Care performed:  None ____________________________________________   INITIAL IMPRESSION / ASSESSMENT AND PLAN / ED COURSE  23 y.o. female with history of prediabetes, obesity, asthma, hidradenitis suppurativa who presents for evaluation of facial swelling x 2 days after using a coworker lip gloss.  Patient  has swelling of upper and lower lip with no trismus, soft floor of the mouth with no induration, no drooling, breathing normally, no difficulty swallowing, no stridor, tongue is normal and so is uvula, no peritonsillar lesions, abscess, or hypertrophy/erythema.  No other signs of an allergic reaction however this is most likely an allergic reaction based on history and symptoms.  Since patient feels like her throat is closing will treat with EpiPen, Solu-Medrol, Pepcid, and Benadryl.  Will monitor closely.    _________________________ 7:41 AM on 04/28/2019 -----------------------------------------  Patient symptoms are improving.  She no longer has sensation that her throat is closing.  Swelling has come down.  We will continue to monitor for total 4 hours post epi.  Patient will be sent home on steroids.  Prescription for EpiPen has been provided.  Discussed indications for EpiPen administration and close follow-up with PCP for further evaluation of her allergic reaction.  Discussed my standard return precautions.  Care transferred to Dr. Ellender Hose.    As part of my medical decision making, I reviewed the following data within the  electronic MEDICAL RECORD NUMBER Nursing notes reviewed and incorporated, Old chart reviewed, Notes from prior ED visits and [place] Controlled Substance Database   Patient was evaluated in Emergency Department today for the symptoms described in the history of present illness. Patient was evaluated in the context of the global COVID-19 pandemic, which necessitated consideration that the patient might be at risk for infection with the SARS-CoV-2 virus that causes COVID-19. Institutional protocols and algorithms that pertain to the evaluation of patients at risk for COVID-19 are in a state of rapid change based on information released by regulatory bodies including the CDC and federal and state organizations. These policies and algorithms were followed during the patient's care in the ED.    ____________________________________________   FINAL CLINICAL IMPRESSION(S) / ED DIAGNOSES   Final diagnoses:  Allergic reaction, initial encounter      NEW MEDICATIONS STARTED DURING THIS VISIT:  ED Discharge Orders         Ordered    EPINEPHrine 0.3 mg/0.3 mL IJ SOAJ injection  As needed     04/28/19 0739    predniSONE (DELTASONE) 20 MG tablet  Daily     04/28/19 0739           Note:  This document was prepared using Dragon voice recognition software and may include unintentional dictation errors.    Nita Sickle, MD 04/28/19 [phone]

## 2019-04-28 NOTE — ED Notes (Addendum)
Patient states swelling started yesterday evening on right side of face. Per patient she took one benadryl last night and went to bed woke up this morning swelling worse took two more benadryl with no relief. Per patient no new foods or meds. Obvious swelling to right side of face. Patient states area hurts 5/10.

## 2019-07-03 ENCOUNTER — Other Ambulatory Visit: Payer: Self-pay

## 2019-07-03 ENCOUNTER — Emergency Department
Admission: EM | Admit: 2019-07-03 | Discharge: 2019-07-03 | Disposition: A | Discharge status: Home / Self Care | Payer: Self-pay | Attending: Emergency Medicine | Admitting: Emergency Medicine

## 2019-07-03 DIAGNOSIS — J45909 Unspecified asthma, uncomplicated: Secondary | ICD-10-CM | POA: Insufficient documentation

## 2019-07-03 DIAGNOSIS — T7840XA Allergy, unspecified, initial encounter: Secondary | ICD-10-CM | POA: Insufficient documentation

## 2019-07-03 DIAGNOSIS — R070 Pain in throat: Secondary | ICD-10-CM

## 2019-07-03 MED ORDER — EPINEPHRINE 0.3 MG/0.3ML IJ SOAJ: 0.3000 mg | Freq: Once | INTRAMUSCULAR | 0 refills | Status: AC

## 2019-07-03 MED ORDER — DEXAMETHASONE 10 MG/ML FOR PEDIATRIC ORAL USE
10.0000 mg | Freq: Once | INTRAMUSCULAR | Status: AC
  Administered 2019-07-03: 10 mg via ORAL
  Filled 2019-07-03: qty 1

## 2019-07-03 NOTE — ED Triage Notes (Signed)
Patient reports concerned might be having an allergic reaction.  States ate at [facility] last pm and concerned they might have cooked shrimp with or close to her food as she is allergic to shrimp.  States the "glans" in her throat were swollen when she woke this morning.  Patient is speaking in complete sentences without difficulty or distress.  No obvious swelling to lips or tongue.

## 2019-07-03 NOTE — ED Notes (Signed)
Pt converses easily, complains of mild pain to throat. No shortness or breath or labored breathing. Pt is alert and oriented x4. Pt reports taking "one benadryl last night," and nothing since. Pt reports onset of symptoms last night after dinner

## 2019-07-03 NOTE — Discharge Instructions (Addendum)
As we discussed, it is difficult to tell at this time whether or not you are having an allergic reaction, but we gave you a dose of medication called Decadron that is a steroid and should help suppress your immune system for the next several days.  Please continue using over-the-counter Benadryl according to label instructions as needed.  Return immediately to the emergency department if you develop new or worsening symptoms.  You were given a prescription for EpiPen's and you should keep them with you at all time.  If you have to use them in the future, please use the pen and then call 911 or come immediately to the emergency department for further evaluation.

## 2019-07-03 NOTE — ED Provider Notes (Signed)
[facility] Emergency Department Provider Note  ____________________________________________   First MD Initiated Contact with Patient 07/03/19 0601     (approximate)  I have reviewed the triage vital signs and the nursing notes.   HISTORY  Chief Complaint Allergic Reaction    HPI Vanessa Booker is a 24 y.o. female with medical history as listed below who presents for evaluation of possible allergic reaction.  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  Shortly thereafter she says she just felt a little bit off or weird and started being concerned about swelling in her throat or her tongue.  She took a dose of Benadryl and went to bed.  When she awoke this morning she still felt that way with a little bit of scratchiness in her throat and she felt like there was more swelling in her throat and her tongue so she came in.  She said that the first time she had allergic reaction to shrimp it was very severe.  She does not have EpiPen at home.  She denies difficulty swallowing and speaking.  She is not having any trouble breathing.  No nausea or vomiting.  No abdominal pain.  No rash.  Symptoms were gradual in onset over the last few hours and relatively mild but enough to make her concerned.         Past Medical History:  Diagnosis Date  . Asthma   . Headaches due to old head injury   . History of PFTs sept 2013   normal   . Prediabetes   . Reflux     Patient Active Problem List   Diagnosis Date Noted  . Morbid obesity (HCC) 08/01/2016  . Left arm pain 04/15/2016  . Adjustment disorder with mixed anxiety and depressed mood 09/13/2013  . Folliculitis 07/27/2013  . Hidradenitis suppurativa 10/01/2012  . Prediabetes 09/21/2012    No past surgical history on file.  Prior to Admission medications   Medication Sig Start Date End Date Taking? Authorizing Provider    EPINEPHrine (EPIPEN 2-PAK) 0.3 mg/0.3 mL IJ SOAJ injection Inject 0.3 mLs (0.3 mg total) into the muscle once for 1 dose. Take for severe allergic reaction, then come immediately to the Emergency Department or call 911. 07/03/19 07/03/19  Loleta Rose, MD  ibuprofen (ADVIL,MOTRIN) 800 MG tablet Take 1 tablet (800 mg total) by mouth every 8 (eight) hours as needed. 11/29/15   Emily Filbert, MD    Allergies Celexa [citalopram hydrobromide], Metformin and related, Shrimp [shellfish allergy], Cefazolin, and Cefzil [cefprozil]  Family History  Problem Relation Age of Onset  . Diabetes Mother   . Hypertension Mother   . Depression Mother   . Kidney disease Mother   . Anxiety disorder Mother   . Cancer Father        passed away from colon cancer  . Cancer Maternal Aunt   . Anxiety disorder Maternal Aunt   . Depression Maternal Aunt   . Diabetes Maternal Grandfather   . Hypertension Maternal Grandfather   . Anxiety disorder Maternal Grandfather   . Depression Maternal Grandfather     Social History Social History   Tobacco Use  . Smoking status: Never Smoker  . Smokeless tobacco: Never Used  Substance Use Topics  . Alcohol use: No    Alcohol/week: 0.0 standard drinks  . Drug use: No    Review of  Systems Constitutional: No fever/chills Eyes: No visual changes. ENT: Subjective swelling of throat and tongue with mild "scratchy" sore throat Respiratory: Denies shortness of breath. Gastrointestinal: No abdominal pain.  No nausea, no vomiting.  No diarrhea.   Integumentary: Negative for rash. Neurological: Negative for headaches, focal weakness or numbness.   ____________________________________________   PHYSICAL EXAM:  VITAL SIGNS: ED Triage Vitals  Enc Vitals Group     BP 07/03/19 0515 133/72     Pulse Rate 07/03/19 0515 84     Resp 07/03/19 0515 18     Temp 07/03/19 0515 97.9 F (36.6 C)     Temp Source 07/03/19 0515 Oral     SpO2 07/03/19 0515 96 %     Weight  07/03/19 0516 107.5 kg (237 lb)     Height 07/03/19 0516 1.651 m (5\' 5" )     Head Circumference --      Peak Flow --      Pain Score 07/03/19 0515 0     Pain Loc --      Pain Edu? --      Excl. in [place]? --     Constitutional: Alert and oriented.  No acute distress. Eyes: Conjunctivae are normal.  Head: Atraumatic. Nose: No congestion/rhinnorhea. Mouth/Throat: No visible angioedema, no erythema, no swelling, soft under the tongue, no ulcerative lesions. Neck: No stridor.  No meningeal signs.  No cervical lymphadenopathy. Cardiovascular: Normal rate, regular rhythm. Good peripheral circulation. Respiratory: Normal respiratory effort.  No retractions. Neurologic:  Normal speech and language. No gross focal neurologic deficits are appreciated.  Skin:  Skin is warm, dry and intact. Psychiatric: Mood and affect are normal. Speech and behavior are normal.  ____________________________________________   LABS (all labs ordered are listed, but only abnormal results are displayed)  Labs Reviewed - No data to display ____________________________________________  EKG  None - EKG not ordered by ED physician ____________________________________________  RADIOLOGY I, Hinda Kehr, personally viewed and evaluated these images (plain radiographs) as part of my medical decision making, as well as reviewing the written report by the radiologist.  ED MD interpretation: No indication for emergent imaging  Official radiology report(s): No results found.  ____________________________________________   PROCEDURES   Procedure(s) performed (including Critical Care):  Procedures   ____________________________________________   INITIAL IMPRESSION / MDM / [place] / ED COURSE  As part of my medical decision making, I reviewed the following data within the [place] notes reviewed and incorporated, Old chart reviewed and Notes from prior ED  visits   No indication of angioedema, medication of acute infection.  Mild allergic reaction is possible and I am giving a dose of Decadron 10 mg by mouth and encouraged her to continue using Benadryl but there is no indication for further evaluation or treatment at this time.  Patient is comfortable with this plan.  I offered COVID-19 testing given that that is the other most likely reason that the patient is having some mild sore throat and sensation of swelling, which she declines.  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  I gave my usual and customary return precautions.          ____________________________________________  FINAL CLINICAL IMPRESSION(S) / ED DIAGNOSES  Final diagnoses:  Throat discomfort  Allergic reaction, initial encounter     MEDICATIONS GIVEN DURING THIS VISIT:  Medications  dexamethasone (  DECADRON) 10 MG/ML injection for Pediatric ORAL use 10 mg (has no administration in time range)     ED Discharge Orders         Ordered    EPINEPHrine (EPIPEN 2-PAK) 0.3 mg/0.3 mL IJ SOAJ injection   Once     07/03/19 1540          *Please note:  SWETA HALSETH was evaluated in Emergency Department on 07/03/2019 for the symptoms described in the history of present illness. She was evaluated in the context of the global COVID-19 pandemic, which necessitated consideration that the patient might be at risk for infection with the SARS-CoV-2 virus that causes COVID-19. Institutional protocols and algorithms that pertain to the evaluation of patients at risk for COVID-19 are in a state of rapid change based on information released by regulatory bodies including the CDC and federal and state organizations. These policies and algorithms were followed during the patient's care in the ED.  Some ED evaluations and interventions may be delayed as a result of limited staffing  during the pandemic.*  Note:  This document was prepared using Dragon voice recognition software and may include unintentional dictation errors.   Loleta Rose, MD 07/03/19 [phone]

## 2019-07-03 NOTE — ED Notes (Signed)
Report to ashley and jennifer, rn.  

## 2019-11-14 ENCOUNTER — Encounter: Payer: Self-pay | Admitting: Physician Assistant

## 2019-11-14 ENCOUNTER — Emergency Department: Payer: Self-pay

## 2019-11-14 ENCOUNTER — Other Ambulatory Visit: Payer: Self-pay

## 2019-11-14 ENCOUNTER — Emergency Department
Admission: EM | Admit: 2019-11-14 | Discharge: 2019-11-14 | Disposition: A | Discharge status: Home / Self Care | Payer: Self-pay | Attending: Student in an Organized Health Care Education/Training Program | Admitting: Student in an Organized Health Care Education/Training Program

## 2019-11-14 DIAGNOSIS — S82891A Other fracture of right lower leg, initial encounter for closed fracture: Secondary | ICD-10-CM

## 2019-11-14 DIAGNOSIS — Y999 Unspecified external cause status: Secondary | ICD-10-CM | POA: Insufficient documentation

## 2019-11-14 DIAGNOSIS — Y929 Unspecified place or not applicable: Secondary | ICD-10-CM | POA: Insufficient documentation

## 2019-11-14 DIAGNOSIS — J45909 Unspecified asthma, uncomplicated: Secondary | ICD-10-CM | POA: Insufficient documentation

## 2019-11-14 DIAGNOSIS — S82391A Other fracture of lower end of right tibia, initial encounter for closed fracture: Secondary | ICD-10-CM | POA: Insufficient documentation

## 2019-11-14 DIAGNOSIS — W500XXA Accidental hit or strike by another person, initial encounter: Secondary | ICD-10-CM | POA: Insufficient documentation

## 2019-11-14 DIAGNOSIS — Y939 Activity, unspecified: Secondary | ICD-10-CM | POA: Insufficient documentation

## 2019-11-14 MED ORDER — MELOXICAM 15 MG PO TABS: 15.0000 mg | ORAL_TABLET | Freq: Every day | ORAL | 2 refills | Status: DC

## 2019-11-14 MED ORDER — HYDROCODONE-ACETAMINOPHEN 5-325 MG PO TABS: 1.0000 | ORAL_TABLET | Freq: Four times a day (QID) | ORAL | 0 refills | Status: DC | PRN

## 2019-11-14 NOTE — ED Notes (Signed)
See triage note  States some one fell on to her right ankle last pm  States she is not able to bear wt  Good pulses

## 2019-11-14 NOTE — ED Triage Notes (Addendum)
Pt arrived via POV with reports of right ankle pain from injury sustained last night. Pt states someone fell on her ankle.  Unable to bear weight. Swelling noted.

## 2019-11-14 NOTE — Discharge Instructions (Signed)
Follow-up with Dr. Samuel Germany office.  Please call for an appointment.  Tell them you were seen in the ED and have a broken tibia.  Keep the ankle elevated and iced.  Use your crutches and do not bear weight on the right foot.  Take the meloxicam daily.  Vicodin for pain not controlled by meloxicam.

## 2019-11-14 NOTE — ED Provider Notes (Signed)
[facility] Emergency Department Provider Note  ____________________________________________   First MD Initiated Contact with Patient 11/14/19 1231     (approximate)  I have reviewed the triage vital signs and the nursing notes.   HISTORY  Chief Complaint Ankle Pain    HPI Vanessa Booker is a 24 y.o. female presents emergency department complaint of right ankle pain.  States last night someone landed on the right ankle and she felt a pop like the knee pop.  Knuckles.  Pain with weightbearing.  No numbness or tingling.  No other injuries reported.   Pain is rated at 10/10   Past Medical History:  Diagnosis Date  . Asthma   . Headaches due to old head injury   . History of PFTs sept 2013   normal   . Prediabetes   . Reflux     Patient Active Problem List   Diagnosis Date Noted  . Morbid obesity (HCC) 08/01/2016  . Left arm pain 04/15/2016  . Adjustment disorder with mixed anxiety and depressed mood 09/13/2013  . Folliculitis 07/27/2013  . Hidradenitis suppurativa 10/01/2012  . Prediabetes 09/21/2012    History reviewed. No pertinent surgical history.  Prior to Admission medications   Medication Sig Start Date End Date Taking? Authorizing Provider  HYDROcodone-acetaminophen (NORCO/VICODIN) 5-325 MG tablet Take 1 tablet by mouth every 6 (six) hours as needed for moderate pain. 11/14/19   Drew Herman, Roselyn Bering, PA-C  meloxicam (MOBIC) 15 MG tablet Take 1 tablet (15 mg total) by mouth daily. 11/14/19 11/13/20  Sherrie Mustache Roselyn Bering, PA-C    Allergies Celexa [citalopram hydrobromide], Metformin, Metformin and related, Shrimp [shellfish allergy], Cefazolin, and Cefprozil  Family History  Problem Relation Age of Onset  . Diabetes Mother   . Hypertension Mother   . Depression Mother   . Kidney disease Mother   . Anxiety disorder Mother   . Cancer Father        passed away from colon cancer  . Cancer Maternal Aunt   . Anxiety disorder Maternal Aunt   .  Depression Maternal Aunt   . Diabetes Maternal Grandfather   . Hypertension Maternal Grandfather   . Anxiety disorder Maternal Grandfather   . Depression Maternal Grandfather     Social History Social History   Tobacco Use  . Smoking status: Never Smoker  . Smokeless tobacco: Never Used  Substance Use Topics  . Alcohol use: No    Alcohol/week: 0.0 standard drinks  . Drug use: No    Review of Systems  Constitutional: No fever/chills Eyes: No visual changes. ENT: No sore throat. Respiratory: Denies cough Cardiovascular: Denies chest pain Gastrointestinal: Denies abdominal pain Genitourinary: Negative for dysuria. Musculoskeletal: Negative for back pain.  Right ankle pain Skin: Negative for rash. Psychiatric: no mood changes,     ____________________________________________   PHYSICAL EXAM:  VITAL SIGNS: ED Triage Vitals  Enc Vitals Group     BP 11/14/19 1214 122/76     Pulse Rate 11/14/19 1214 89     Resp 11/14/19 1214 18     Temp --      Temp Source 11/14/19 1214 Oral     SpO2 11/14/19 1214 98 %     Weight 11/14/19 1215 240 lb (108.9 kg)     Height 11/14/19 1215 5\' 5"  (1.651 m)     Head Circumference --      Peak Flow --      Pain Score 11/14/19 1214 10     Pain Loc --  Pain Edu? --      Excl. in [place]? --     Constitutional: Alert and oriented. Well appearing and in no acute distress. Eyes: Conjunctivae are normal.  Head: Atraumatic. Nose: No congestion/rhinnorhea. Mouth/Throat: Mucous membranes are moist.   Neck:  supple no lymphadenopathy noted Cardiovascular: Normal rate, regular rhythm.  Respiratory: Normal respiratory effort.  No retractions, GU: deferred Musculoskeletal: FROM all extremities, warm and well perfused, right ankle is tender at the lateral malleolus, some swelling is noted, neurovascular is intact Neurologic:  Normal speech and language.  Skin:  Skin is warm, dry and intact. No rash noted. Psychiatric: Mood and affect are  normal. Speech and behavior are normal.  ____________________________________________   LABS (all labs ordered are listed, but only abnormal results are displayed)  Labs Reviewed - No data to display ____________________________________________   ____________________________________________  RADIOLOGY  X-ray of the right ankle shows a tibial fracture  ____________________________________________   PROCEDURES  Procedure(s) performed: Posterior OCL, crutches applied by the tech , neurovascular is intact post splint application  Procedures    ____________________________________________   INITIAL IMPRESSION / ASSESSMENT AND PLAN / ED COURSE  Pertinent labs & imaging results that were available during my care of the patient were reviewed by me and considered in my medical decision making (see chart for details).   Patient is 24 year old female presents emergency department for right ankle injury.  See HPI  Physical exam shows patient to appear well.  Right ankle is tender and swollen.  Neurovascular is intact  X-ray of the right ankle rule out fracture   X-ray of the right ankle shows a posterior malleolus fracture of the tibia   I did explain the findings to the patient.  Explained her she needs to be in a posterior OCL and on crutches.  She is not to bear weight.  Follow-up with orthopedics.  She was given a work note as she works for [employer] stating that she cannot return to work until released by orthopedics.  She was given a prescription for meloxicam and hydrocodone.  She is to elevate and ice.  She is discharged stable condition in the care of her friend.  Vanessa Booker was evaluated in Emergency Department on 11/14/2019 for the symptoms described in the history of present illness. She was evaluated in the context of the global COVID-19 pandemic, which necessitated consideration that the patient might be at risk for infection with the SARS-CoV-2 virus that  causes COVID-19. Institutional protocols and algorithms that pertain to the evaluation of patients at risk for COVID-19 are in a state of rapid change based on information released by regulatory bodies including the CDC and federal and state organizations. These policies and algorithms were followed during the patient's care in the ED.   As part of my medical decision making, I reviewed the following data within the [place] notes reviewed and incorporated, Old chart reviewed, Radiograph reviewed , Notes from prior ED visits and [place] Controlled Substance Database  ____________________________________________   FINAL CLINICAL IMPRESSION(S) / ED DIAGNOSES  Final diagnoses:  Closed right ankle fracture, initial encounter      NEW MEDICATIONS STARTED DURING THIS VISIT:  Discharge Medication List as of 11/14/2019  1:49 PM    START taking these medications   Details  HYDROcodone-acetaminophen (NORCO/VICODIN) 5-325 MG tablet Take 1 tablet by mouth every 6 (six) hours as needed for moderate pain., Starting Sun 11/14/2019, Normal    meloxicam (MOBIC) 15 MG  tablet Take 1 tablet (15 mg total) by mouth daily., Starting Sun 11/14/2019, Until Mon 11/13/2020, Normal         Note:  This document was prepared using Dragon voice recognition software and may include unintentional dictation errors.    Faythe Ghee, PA-C 11/14/19 1516    Willy Eddy, MD 11/14/19 1535

## 2019-11-18 DIAGNOSIS — S82399A Other fracture of lower end of unspecified tibia, initial encounter for closed fracture: Secondary | ICD-10-CM | POA: Insufficient documentation

## 2020-03-17 ENCOUNTER — Encounter: Payer: Self-pay | Admitting: Emergency Medicine

## 2020-03-17 ENCOUNTER — Other Ambulatory Visit: Payer: Self-pay

## 2020-03-17 ENCOUNTER — Emergency Department
Admission: EM | Admit: 2020-03-17 | Discharge: 2020-03-17 | Disposition: A | Discharge status: Home / Self Care | Payer: BC Managed Care – PPO | Attending: Emergency Medicine | Admitting: Emergency Medicine

## 2020-03-17 ENCOUNTER — Emergency Department: Payer: BC Managed Care – PPO

## 2020-03-17 DIAGNOSIS — R110 Nausea: Secondary | ICD-10-CM | POA: Insufficient documentation

## 2020-03-17 DIAGNOSIS — R1013 Epigastric pain: Secondary | ICD-10-CM | POA: Diagnosis not present

## 2020-03-17 DIAGNOSIS — R109 Unspecified abdominal pain: Secondary | ICD-10-CM | POA: Diagnosis present

## 2020-03-17 DIAGNOSIS — R197 Diarrhea, unspecified: Secondary | ICD-10-CM | POA: Insufficient documentation

## 2020-03-17 DIAGNOSIS — J45909 Unspecified asthma, uncomplicated: Secondary | ICD-10-CM | POA: Insufficient documentation

## 2020-03-17 LAB — COMPREHENSIVE METABOLIC PANEL
ALT: 17 U/L (ref 0–44)
AST: 17 U/L (ref 15–41)
Albumin: 4 g/dL (ref 3.5–5.0)
Alkaline Phosphatase: 50 U/L (ref 38–126)
Anion gap: 7 (ref 5–15)
BUN: 15 mg/dL (ref 6–20)
CO2: 26 mmol/L (ref 22–32)
Calcium: 9.4 mg/dL (ref 8.9–10.3)
Chloride: 104 mmol/L (ref 98–111)
Creatinine, Ser: 0.66 mg/dL (ref 0.44–1.00)
GFR calc Af Amer: >60 mL/min (ref >60)
GFR calc non Af Amer: >60 mL/min (ref >60)
Glucose, Bld: 117 mg/dL — ABNORMAL HIGH (ref 70–99)
Potassium: 4 mmol/L (ref 3.5–5.1)
Sodium: 137 mmol/L (ref 135–145)
Total Bilirubin: 1 mg/dL (ref 0.3–1.2)
Total Protein: 7.6 g/dL (ref 6.5–8.1)

## 2020-03-17 LAB — CBC
HCT: 37.2 % (ref 36.0–46.0)
Hemoglobin: 12.3 g/dL (ref 12.0–15.0)
MCH: 28.9 pg (ref 26.0–34.0)
MCHC: 33.1 g/dL (ref 30.0–36.0)
MCV: 87.3 fL (ref 80.0–100.0)
Platelets: 268 10*3/uL (ref 150–400)
RBC: 4.26 MIL/uL (ref 3.87–5.11)
RDW: 12.9 % (ref 11.5–15.5)
WBC: 6.8 10*3/uL (ref 4.0–10.5)
nRBC: 0 % (ref 0.0–0.2)

## 2020-03-17 LAB — URINALYSIS, COMPLETE (UACMP) WITH MICROSCOPIC
Bacteria, UA: NONE SEEN
Bilirubin Urine: NEGATIVE
Glucose, UA: NEGATIVE mg/dL
Hgb urine dipstick: NEGATIVE
Ketones, ur: NEGATIVE mg/dL
Leukocytes,Ua: NEGATIVE
Nitrite: NEGATIVE
Protein, ur: NEGATIVE mg/dL
Specific Gravity, Urine: 1.015 (ref 1.005–1.030)
pH: 6 (ref 5.0–8.0)

## 2020-03-17 LAB — LIPASE, BLOOD: Lipase: 39 U/L (ref 11–51)

## 2020-03-17 MED ORDER — HYOSCYAMINE SULFATE SL 0.125 MG SL SUBL: 0.1250 mg | SUBLINGUAL_TABLET | Freq: Three times a day (TID) | SUBLINGUAL | 1 refills | Status: DC

## 2020-03-17 MED ORDER — SODIUM CHLORIDE 0.9 % IV BOLUS
1000.0000 mL | Freq: Once | INTRAVENOUS | Status: AC
  Administered 2020-03-17: 1000 mL via INTRAVENOUS

## 2020-03-17 MED ORDER — SUCRALFATE 1 GM/10ML PO SUSP: 1.0000 g | Freq: Four times a day (QID) | ORAL | 0 refills | Status: DC

## 2020-03-17 MED ORDER — ONDANSETRON HCL 4 MG/2ML IJ SOLN
4.0000 mg | Freq: Once | INTRAMUSCULAR | Status: AC
  Administered 2020-03-17: 4 mg via INTRAVENOUS
  Filled 2020-03-17: qty 2

## 2020-03-17 NOTE — ED Notes (Signed)
Patient to ED for abdominal pain x 2 days. Diarrhea without vomiting.

## 2020-03-17 NOTE — ED Provider Notes (Signed)
[facility] Emergency Department Provider Note  ____________________________________________   First MD Initiated Contact with Patient 03/17/20 1655     (approximate)  I have reviewed the triage vital signs and the nursing notes.   HISTORY  Chief Complaint Abdominal Pain  HPI Vanessa Booker is a 24 y.o. female who presents to the emergency department for treatment and evaluation of abdominal pain and nausea without vomiting. She has also had a few episodes of diarrhea. No alleviating measures prior to arrival.      Past Medical History:  Diagnosis Date  . Asthma   . Headaches due to old head injury   . History of PFTs sept 2013   normal   . Prediabetes   . Reflux     Patient Active Problem List   Diagnosis Date Noted  . Morbid obesity (HCC) 08/01/2016  . Left arm pain 04/15/2016  . Adjustment disorder with mixed anxiety and depressed mood 09/13/2013  . Folliculitis 07/27/2013  . Hidradenitis suppurativa 10/01/2012  . Prediabetes 09/21/2012    History reviewed. No pertinent surgical history.  Prior to Admission medications   Medication Sig Start Date End Date Taking? Authorizing Provider  Hyoscyamine Sulfate SL (LEVSIN/SL) 0.125 MG SUBL Place 0.125 mg under the tongue 3 (three) times daily before meals. 03/17/20   Deniel Mcquiston, Rulon Eisenmenger B, FNP  sucralfate (CARAFATE) 1 GM/10ML suspension Take 10 mLs (1 g total) by mouth 4 (four) times daily. 03/17/20 03/17/21  Chinita Pester, FNP    Allergies Celexa [citalopram hydrobromide], Metformin, Metformin and related, Shrimp [shellfish allergy], Cefazolin, and Cefprozil  Family History  Problem Relation Age of Onset  . Diabetes Mother   . Hypertension Mother   . Depression Mother   . Kidney disease Mother   . Anxiety disorder Mother   . Cancer Father        passed away from colon cancer  . Cancer Maternal Aunt   . Anxiety disorder Maternal Aunt   . Depression Maternal Aunt   . Diabetes Maternal  Grandfather   . Hypertension Maternal Grandfather   . Anxiety disorder Maternal Grandfather   . Depression Maternal Grandfather     Social History Social History   Tobacco Use  . Smoking status: Never Smoker  . Smokeless tobacco: Never Used  Substance Use Topics  . Alcohol use: No    Alcohol/week: 0.0 standard drinks  . Drug use: No    Review of Systems  Constitutional: No fever/chills Eyes: No visual changes. ENT: No sore throat. Cardiovascular: Denies chest pain. Respiratory: Denies shortness of breath. Gastrointestinal: Positive for abdominal pain and cramping with nausea. Positive for diarrhea. Genitourinary: Negative for dysuria. Musculoskeletal: Negative for back pain. Skin: Negative for rash. Neurological: Negative for headaches, focal weakness or numbness. ____________________________________________   PHYSICAL EXAM:  VITAL SIGNS: ED Triage Vitals  Enc Vitals Group     BP 03/17/20 1551 127/65     Pulse Rate 03/17/20 1551 78     Resp 03/17/20 1551 14     Temp 03/17/20 1551 98.3 F (36.8 C)     Temp Source 03/17/20 1551 Oral     SpO2 03/17/20 1551 98 %     Weight 03/17/20 1553 262 lb (118.8 kg)     Height 03/17/20 1553 5\' 6"  (1.676 m)     Head Circumference --      Peak Flow --      Pain Score 03/17/20 1552 6     Pain Loc --  Pain Edu? --      Excl. in [place]? --    Constitutional: Alert and oriented. Well appearing and in no acute distress. Eyes: Conjunctivae are normal. Head: Atraumatic. Nose: No congestion/rhinnorhea. Mouth/Throat: Mucous membranes are moist.  Oropharynx non-erythematous. Neck: No stridor.   Cardiovascular: Normal rate, regular rhythm. Grossly normal heart sounds.  Good peripheral circulation. Respiratory: Normal respiratory effort.  No retractions. Lungs CTAB. Gastrointestinal: Soft and nontender. No distention. No abdominal bruits. Musculoskeletal: No lower extremity tenderness nor edema.  No joint effusions. Neurologic:   Normal speech and language. No gross focal neurologic deficits are appreciated. No gait instability. Skin:  Skin is warm, dry and intact. No rash noted. Psychiatric: Mood and affect are normal. Speech and behavior are normal.  ____________________________________________   LABS (all labs ordered are listed, but only abnormal results are displayed)  Labs Reviewed  COMPREHENSIVE METABOLIC PANEL - Abnormal; Notable for the following components:      Result Value   Glucose, Bld 117 (*)    All other components within normal limits  URINALYSIS, COMPLETE (UACMP) WITH MICROSCOPIC - Abnormal; Notable for the following components:   Color, Urine YELLOW (*)    APPearance CLEAR (*)    All other components within normal limits  LIPASE, BLOOD  CBC  PREGNANCY, URINE   ____________________________________________  EKG  Not indicated. ____________________________________________  RADIOLOGY  ED MD interpretation:  US of the RUQ is negative for acute findings per radiology.  Official radiology report(s): US Abdomen Limited RUQ  Result Date: 03/17/2020 CLINICAL DATA:  Acute epigastric pain for 2 days EXAM: ULTRASOUND ABDOMEN LIMITED RIGHT UPPER QUADRANT COMPARISON:  None. FINDINGS: Gallbladder: No gallstones or wall thickening visualized. No sonographic Murphy sign noted by sonographer. Common bile duct: Diameter: 4.1 mm Liver: Increased echogenicity. No focal mass. Portal vein is patent on color Doppler imaging with normal direction of blood flow towards the liver. Other: None. IMPRESSION: 1. No acute abnormalities. 2. Increased echogenicity in the liver is nonspecific but often due to hepatic steatosis. No focal mass. Electronically Signed   By: Gerome Sam III M.D   On: 03/17/2020 18:02    ____________________________________________   PROCEDURES  Procedure(s) performed (including Critical Care):  Procedures   ____________________________________________   INITIAL IMPRESSION /  ASSESSMENT AND PLAN / ED COURSE  24 year old female presents to the emergency department for treatment and evaluation of epigastric abdominal pain. See HPI. Plan will be to get an US of the RUQ and give IV fluids.   [place] is normal. IV NS infusing. Awaiting urinalysis results.  UA is normal. Plan will be to treat her with Levsin and Carafate. She is to follow up with GI or PCP for symptoms not improving with medication. She is to return to the ER for symptoms of concern. ___________________________________________   FINAL CLINICAL IMPRESSION(S) / ED DIAGNOSES  Final diagnoses:  Acute epigastric pain     ED Discharge Orders         Ordered    Hyoscyamine Sulfate SL (LEVSIN/SL) 0.125 MG SUBL  3 times daily before meals        03/17/20 1916    sucralfate (CARAFATE) 1 GM/10ML suspension  4 times daily        03/17/20 1916          *Please note:  Vanessa Booker was evaluated in Emergency Department on 03/17/2020 for the symptoms described in the history of present illness. She was evaluated in the context of the global COVID-19 pandemic,  which necessitated consideration that the patient might be at risk for infection with the SARS-CoV-2 virus that causes COVID-19. Institutional protocols and algorithms that pertain to the evaluation of patients at risk for COVID-19 are in a state of rapid change based on information released by regulatory bodies including the CDC and federal and state organizations. These policies and algorithms were followed during the patient's care in the ED.  Some ED evaluations and interventions may be delayed as a result of limited staffing during and the pandemic.*   Note:  This document was prepared using Dragon voice recognition software and may include unintentional dictation errors.    Chinita Pester, FNP 03/17/20 2017    Delton Pra[place], MD 03/17/20 [phone]

## 2020-03-17 NOTE — ED Triage Notes (Signed)
Pain upper mid abd that is worse after eating for couple days.  Vomiting.  Diarrhea as well.

## 2020-03-17 NOTE — Discharge Instructions (Signed)
Follow up with primary care or the GI specialist if the medication prescribed does not help.  Return to the ER for symptoms that change or worsen if unable to schedule an appointment.

## 2020-03-17 NOTE — ED Triage Notes (Signed)
Patient had said she could not keep anythinng on her stomach, but she says now that she actually has not vomited, just felt like it.

## 2020-04-13 ENCOUNTER — Encounter: Payer: Self-pay | Admitting: Emergency Medicine

## 2020-04-13 ENCOUNTER — Emergency Department
Admission: EM | Admit: 2020-04-13 | Discharge: 2020-04-13 | Disposition: A | Discharge status: Home / Self Care | Payer: BC Managed Care – PPO | Attending: Student in an Organized Health Care Education/Training Program | Admitting: Student in an Organized Health Care Education/Training Program

## 2020-04-13 ENCOUNTER — Other Ambulatory Visit: Payer: Self-pay

## 2020-04-13 ENCOUNTER — Emergency Department: Payer: BC Managed Care – PPO

## 2020-04-13 DIAGNOSIS — R112 Nausea with vomiting, unspecified: Secondary | ICD-10-CM | POA: Diagnosis not present

## 2020-04-13 DIAGNOSIS — R079 Chest pain, unspecified: Secondary | ICD-10-CM | POA: Insufficient documentation

## 2020-04-13 DIAGNOSIS — R197 Diarrhea, unspecified: Secondary | ICD-10-CM | POA: Diagnosis not present

## 2020-04-13 DIAGNOSIS — Z5321 Procedure and treatment not carried out due to patient leaving prior to being seen by health care provider: Secondary | ICD-10-CM | POA: Insufficient documentation

## 2020-04-13 LAB — CBC WITH DIFFERENTIAL/PLATELET
Abs Immature Granulocytes: 0.03 10*3/uL (ref 0.00–0.07)
Basophils Absolute: 0.1 10*3/uL (ref 0.0–0.1)
Basophils Relative: 1 %
Eosinophils Absolute: 0.4 10*3/uL (ref 0.0–0.5)
Eosinophils Relative: 5 %
HCT: 37.5 % (ref 36.0–46.0)
Hemoglobin: 12.6 g/dL (ref 12.0–15.0)
Immature Granulocytes: 0 %
Lymphocytes Relative: 31 %
Lymphs Abs: 2.4 10*3/uL (ref 0.7–4.0)
MCH: 28.8 pg (ref 26.0–34.0)
MCHC: 33.6 g/dL (ref 30.0–36.0)
MCV: 85.8 fL (ref 80.0–100.0)
Monocytes Absolute: 0.6 10*3/uL (ref 0.1–1.0)
Monocytes Relative: 8 %
Neutro Abs: 4.3 10*3/uL (ref 1.7–7.7)
Neutrophils Relative %: 55 %
Platelets: 284 10*3/uL (ref 150–400)
RBC: 4.37 MIL/uL (ref 3.87–5.11)
RDW: 12.6 % (ref 11.5–15.5)
WBC: 7.7 10*3/uL (ref 4.0–10.5)
nRBC: 0 % (ref 0.0–0.2)

## 2020-04-13 LAB — COMPREHENSIVE METABOLIC PANEL
ALT: 18 U/L (ref 0–44)
AST: 17 U/L (ref 15–41)
Albumin: 3.9 g/dL (ref 3.5–5.0)
Alkaline Phosphatase: 52 U/L (ref 38–126)
Anion gap: 7 (ref 5–15)
BUN: 15 mg/dL (ref 6–20)
CO2: 27 mmol/L (ref 22–32)
Calcium: 9 mg/dL (ref 8.9–10.3)
Chloride: 105 mmol/L (ref 98–111)
Creatinine, Ser: 0.71 mg/dL (ref 0.44–1.00)
GFR, Estimated: >60 mL/min (ref >60)
Glucose, Bld: 116 mg/dL — ABNORMAL HIGH (ref 70–99)
Potassium: 3.8 mmol/L (ref 3.5–5.1)
Sodium: 139 mmol/L (ref 135–145)
Total Bilirubin: 0.9 mg/dL (ref 0.3–1.2)
Total Protein: 7.7 g/dL (ref 6.5–8.1)

## 2020-04-13 LAB — TROPONIN I (HIGH SENSITIVITY): Troponin I (High Sensitivity): <2 ng/L (ref <18)

## 2020-04-13 LAB — LIPASE, BLOOD: Lipase: 44 U/L (ref 11–51)

## 2020-04-13 NOTE — ED Triage Notes (Signed)
Patient ambulatory to triage with steady gait, without difficulty or distress noted; pt reports awoke at 430am with "heaviness" to upper chest accomp by N/V/D

## 2020-04-13 NOTE — ED Triage Notes (Signed)
Pt called from [facility] to treatment room, no response 

## 2020-04-13 NOTE — ED Notes (Signed)
Pt called x's 3, no response ?

## 2020-07-12 ENCOUNTER — Other Ambulatory Visit: Payer: Self-pay

## 2020-07-12 ENCOUNTER — Encounter: Payer: Self-pay | Admitting: Emergency Medicine

## 2020-07-12 ENCOUNTER — Emergency Department
Admission: EM | Admit: 2020-07-12 | Discharge: 2020-07-12 | Disposition: A | Discharge status: Home / Self Care | Payer: BC Managed Care – PPO | Attending: Emergency Medicine | Admitting: Emergency Medicine

## 2020-07-12 DIAGNOSIS — B349 Viral infection, unspecified: Secondary | ICD-10-CM

## 2020-07-12 DIAGNOSIS — U071 COVID-19: Secondary | ICD-10-CM | POA: Diagnosis not present

## 2020-07-12 DIAGNOSIS — Z1152 Encounter for screening for COVID-19: Secondary | ICD-10-CM

## 2020-07-12 DIAGNOSIS — R509 Fever, unspecified: Secondary | ICD-10-CM | POA: Diagnosis present

## 2020-07-12 DIAGNOSIS — J45909 Unspecified asthma, uncomplicated: Secondary | ICD-10-CM | POA: Insufficient documentation

## 2020-07-12 LAB — SARS CORONAVIRUS 2 (TAT 6-24 HRS): SARS Coronavirus 2: POSITIVE — AB

## 2020-07-12 MED ORDER — ONDANSETRON 4 MG PO TBDP: 4.0000 mg | ORAL_TABLET | Freq: Three times a day (TID) | ORAL | 0 refills | Status: DC | PRN

## 2020-07-12 NOTE — ED Provider Notes (Signed)
[facility] Emergency Department Provider Note  ____________________________________________   Event Date/Time   First MD Initiated Contact with Patient 07/12/20 1035     (approximate)  I have reviewed the triage vital signs and the nursing notes.   HISTORY  Chief Complaint Fever and Sore Throat   HPI Vanessa Booker is a 25 y.o. female presents to the ED with complaint of fever, sore throat, body aches that began last evening.  Patient states that she was exposed to someone with COVID at work.  She reports that she had a fever last evening and also this morning.  She has taken ibuprofen with some relief.  Patient denies any respiratory difficulty.  She rates her pain as 6 out of 10.     Past Medical History:  Diagnosis Date  . Asthma   . Headaches due to old head injury   . History of PFTs sept 2013   normal   . Prediabetes   . Reflux     Patient Active Problem List   Diagnosis Date Noted  . Morbid obesity (HCC) 08/01/2016  . Left arm pain 04/15/2016  . Adjustment disorder with mixed anxiety and depressed mood 09/13/2013  . Folliculitis 07/27/2013  . Hidradenitis suppurativa 10/01/2012  . Prediabetes 09/21/2012    History reviewed. No pertinent surgical history.  Prior to Admission medications   Medication Sig Start Date End Date Taking? Authorizing Provider  ondansetron (ZOFRAN ODT) 4 MG disintegrating tablet Take 1 tablet (4 mg total) by mouth every 8 (eight) hours as needed for nausea or vomiting. 07/12/20  Yes Bridget Hartshorn L, PA-C  Hyoscyamine Sulfate SL (LEVSIN/SL) 0.125 MG SUBL Place 0.125 mg under the tongue 3 (three) times daily before meals. 03/17/20   Triplett, Rulon Eisenmenger B, FNP  sucralfate (CARAFATE) 1 GM/10ML suspension Take 10 mLs (1 g total) by mouth 4 (four) times daily. 03/17/20 03/17/21  Chinita Pester, FNP    Allergies Celexa [citalopram hydrobromide], Metformin, Metformin and related, Shrimp [shellfish allergy], Cefazolin,  and Cefprozil  Family History  Problem Relation Age of Onset  . Diabetes Mother   . Hypertension Mother   . Depression Mother   . Kidney disease Mother   . Anxiety disorder Mother   . Cancer Father        passed away from colon cancer  . Cancer Maternal Aunt   . Anxiety disorder Maternal Aunt   . Depression Maternal Aunt   . Diabetes Maternal Grandfather   . Hypertension Maternal Grandfather   . Anxiety disorder Maternal Grandfather   . Depression Maternal Grandfather     Social History Social History   Tobacco Use  . Smoking status: Never Smoker  . Smokeless tobacco: Never Used  Substance Use Topics  . Alcohol use: No    Alcohol/week: 0.0 standard drinks  . Drug use: No    Review of Systems Constitutional: Positive fever/chills Eyes: No visual changes. ENT: Positive sore throat. Cardiovascular: Denies chest pain. Respiratory: Denies shortness of breath. Gastrointestinal: No abdominal pain.  Positive nausea, no vomiting.  No diarrhea.   Genitourinary: Negative for dysuria. Musculoskeletal: Positive for body aches. Skin: Negative for rash. Neurological: Negative for headaches, focal weakness or numbness.  ____________________________________________   PHYSICAL EXAM:  VITAL SIGNS: ED Triage Vitals  Enc Vitals Group     BP 07/12/20 1007 127/77     Pulse Rate 07/12/20 1007 98     Resp 07/12/20 1007 18     Temp 07/12/20 1007 99.2 F (37.3  C)     Temp Source 07/12/20 1007 Oral     SpO2 07/12/20 1007 93 %     Weight 07/12/20 1005 259 lb 14.8 oz (117.9 kg)     Height 07/12/20 1005 5\' 6"  (1.676 m)     Head Circumference --      Peak Flow --      Pain Score 07/12/20 1005 6     Pain Loc --      Pain Edu? --      Excl. in GC? --     Constitutional: Alert and oriented. Well appearing and in no acute distress. Eyes: Conjunctivae are normal.  Head: Atraumatic. Nose: Mild congestion/rhinnorhea. Mouth/Throat: Mucous membranes are moist.  Oropharynx  non-erythematous.  No exudate and uvula is midline. Neck: No stridor.   Hematological/Lymphatic/Immunilogical: No cervical lymphadenopathy. Cardiovascular: Normal rate, regular rhythm. Grossly normal heart sounds.  Good peripheral circulation. Respiratory: Normal respiratory effort.  No retractions. Lungs CTAB. Gastrointestinal: Soft and nontender. No distention.  Musculoskeletal: Moves upper and lower extremities without any difficulty.  Normal gait was noted. Neurologic:  Normal speech and language. No gross focal neurologic deficits are appreciated. No gait instability. Skin:  Skin is warm, dry and intact. No rash noted. Psychiatric: Mood and affect are normal. Speech and behavior are normal.  ____________________________________________   LABS (all labs ordered are listed, but only abnormal results are displayed)  Labs Reviewed  SARS CORONAVIRUS 2 (TAT 6-24 HRS)     PROCEDURES  Procedure(s) performed (including Critical Care):  Procedures   ____________________________________________   INITIAL IMPRESSION / ASSESSMENT AND PLAN / ED COURSE  As part of my medical decision making, I reviewed the following data within the electronic MEDICAL RECORD NUMBER Notes from prior ED visits and [place] Controlled Substance Database  25 year old female presents to the ED with complaint of upper respiratory symptoms along with nausea without vomiting for the last 24 to 36 hours.  Patient states that she was exposed to COVID by one of her coworkers who tested positive.  She states that she had a fever last evening and this morning.  She denies any difficulty breathing.  Physical exam was benign.  Patient is afebrile and an O2 sat of 94%.  A COVID swab was done and patient is aware that it will take 6 to 24 hours to get these results and this can be seen on my chart.  She was given a note for her work.  She is encouraged to quarantine at home and drink lots of fluids.  She is to continue with Tylenol or  ibuprofen.  She is return to the emergency room if she develops any shortness of breath or difficulty breathing.  ____________________________________________   FINAL CLINICAL IMPRESSION(S) / ED DIAGNOSES  Final diagnoses:  Viral illness  Encounter for screening for COVID-19     ED Discharge Orders         Ordered    ondansetron (ZOFRAN ODT) 4 MG disintegrating tablet  Every 8 hours PRN        07/12/20 1128          *Please note:  Vanessa Booker was evaluated in Emergency Department on 07/12/2020 for the symptoms described in the history of present illness. She was evaluated in the context of the global COVID-19 pandemic, which necessitated consideration that the patient might be at risk for infection with the SARS-CoV-2 virus that causes COVID-19. Institutional protocols and algorithms that pertain to the evaluation of patients at risk for COVID-19  are in a state of rapid change based on information released by regulatory bodies including the CDC and federal and state organizations. These policies and algorithms were followed during the patient's care in the ED.  Some ED evaluations and interventions may be delayed as a result of limited staffing during and the pandemic.*   Note:  This document was prepared using Dragon voice recognition software and may include unintentional dictation errors.    Tommi Rumps, PA-C 07/12/20 1216    Gilles Chiquito, MD 07/12/20 [phone]

## 2020-07-12 NOTE — ED Triage Notes (Signed)
C/O fever, sore throat, body aches since last night.  Has not received Covid vaccine.    AAOx3.  Skin warm and dry. NAD

## 2020-07-12 NOTE — Discharge Instructions (Signed)
Follow-up with your primary care provider if any continued problems or questions by phone.  A prescription for Zofran was sent to your pharmacy if needed for nausea and to help control vomiting.  Drink fluids to stay hydrated.  Tylenol or ibuprofen for body aches and fever.  A COVID swab was done today and the results of this test can be seen on MyChart in 6 to 24 hours.  A letter for work is provided.  You also need to quarantine in your home and also notify anyone that you have been around in the last 2 days that you are having symptoms.  Anyone in the home with you will need to quarantine as well.

## 2020-07-13 ENCOUNTER — Telehealth: Payer: Self-pay | Admitting: *Deleted

## 2020-07-13 NOTE — Telephone Encounter (Signed)
Called to discuss with patient about COVID-19 symptoms and the use of one of the available treatments for those with mild to moderate Covid symptoms and at a high risk of hospitalization.  Pt appears to qualify for outpatient treatment due to co-morbid conditions and/or a member of an at-risk group in accordance with the FDA Emergency Use Authorization.    Symptom onset:  Vaccinated:  Booster?  Immunocompromised?  Qualifiers:   Unable to reach pt -No answer. Left VM to return call.   Vanessa Booker   

## 2020-11-22 ENCOUNTER — Emergency Department
Admission: EM | Admit: 2020-11-22 | Discharge: 2020-11-22 | Disposition: A | Discharge status: Home / Self Care | Payer: BC Managed Care – PPO | Attending: Emergency Medicine | Admitting: Emergency Medicine

## 2020-11-22 ENCOUNTER — Other Ambulatory Visit: Payer: Self-pay

## 2020-11-22 DIAGNOSIS — R519 Headache, unspecified: Secondary | ICD-10-CM

## 2020-11-22 DIAGNOSIS — G43909 Migraine, unspecified, not intractable, without status migrainosus: Secondary | ICD-10-CM | POA: Insufficient documentation

## 2020-11-22 DIAGNOSIS — J45909 Unspecified asthma, uncomplicated: Secondary | ICD-10-CM | POA: Diagnosis not present

## 2020-11-22 MED ORDER — DIPHENHYDRAMINE HCL 50 MG/ML IJ SOLN
50.0000 mg | Freq: Once | INTRAMUSCULAR | Status: AC
  Administered 2020-11-22: 50 mg via INTRAVENOUS
  Filled 2020-11-22: qty 1

## 2020-11-22 MED ORDER — METOCLOPRAMIDE HCL 5 MG/ML IJ SOLN
10.0000 mg | Freq: Once | INTRAMUSCULAR | Status: AC
  Administered 2020-11-22: 10 mg via INTRAVENOUS
  Filled 2020-11-22: qty 2

## 2020-11-22 MED ORDER — KETOROLAC TROMETHAMINE 30 MG/ML IJ SOLN
30.0000 mg | Freq: Once | INTRAMUSCULAR | Status: AC
  Administered 2020-11-22: 30 mg via INTRAVENOUS
  Filled 2020-11-22: qty 1

## 2020-11-22 MED ORDER — SODIUM CHLORIDE 0.9 % IV BOLUS
1000.0000 mL | Freq: Once | INTRAVENOUS | Status: AC
  Administered 2020-11-22: 1000 mL via INTRAVENOUS

## 2020-11-22 NOTE — ED Triage Notes (Signed)
Pt to ED POV for headache that started this am. Hx of same. Took ibuprofen PTA. States medications at home typically don't work and "shots at hospital work" Pt in NAD

## 2020-11-22 NOTE — ED Provider Notes (Signed)
[facility]-EMERGENCY DEPARTMENT  ____________________________________________  Time seen: Approximately 5:08 PM  I have reviewed the triage vital signs and the nursing notes.   HISTORY  Chief Complaint Headache   Historian Patient     HPI Vanessa Booker is a 25 y.o. female with a history of headaches, presents to the emergency department with frontal headache that started this morning.  Patient states that she has had similar headaches approximately 3-4 times a year.  She denies nausea, vomiting or weakness of the upper and lower extremities.  She has had some photophobia.  She denies possibility of pregnancy.  No other alleviating measures have been attempted.   Past Medical History:  Diagnosis Date  . Asthma   . Headaches due to old head injury   . History of PFTs sept 2013   normal   . Prediabetes   . Reflux      Immunizations up to date:  Yes.     Past Medical History:  Diagnosis Date  . Asthma   . Headaches due to old head injury   . History of PFTs sept 2013   normal   . Prediabetes   . Reflux     Patient Active Problem List   Diagnosis Date Noted  . Morbid obesity (HCC) 08/01/2016  . Left arm pain 04/15/2016  . Adjustment disorder with mixed anxiety and depressed mood 09/13/2013  . Folliculitis 07/27/2013  . Hidradenitis suppurativa 10/01/2012  . Prediabetes 09/21/2012    No past surgical history on file.  Prior to Admission medications   Medication Sig Start Date End Date Taking? Authorizing Provider  Hyoscyamine Sulfate SL (LEVSIN/SL) 0.125 MG SUBL Place 0.125 mg under the tongue 3 (three) times daily before meals. 03/17/20   Triplett, Rulon Eisenmenger B, FNP  ondansetron (ZOFRAN ODT) 4 MG disintegrating tablet Take 1 tablet (4 mg total) by mouth every 8 (eight) hours as needed for nausea or vomiting. 07/12/20   Tommi Rumps, PA-C  sucralfate (CARAFATE) 1 GM/10ML suspension Take 10 mLs (1 g total) by mouth 4 (four) times daily. 03/17/20 03/17/21  Chinita Pester, FNP    Allergies Celexa [citalopram hydrobromide], Metformin, Metformin and related, Shrimp [shellfish allergy], Cefazolin, and Cefprozil  Family History  Problem Relation Age of Onset  . Diabetes Mother   . Hypertension Mother   . Depression Mother   . Kidney disease Mother   . Anxiety disorder Mother   . Cancer Father        passed away from colon cancer  . Cancer Maternal Aunt   . Anxiety disorder Maternal Aunt   . Depression Maternal Aunt   . Diabetes Maternal Grandfather   . Hypertension Maternal Grandfather   . Anxiety disorder Maternal Grandfather   . Depression Maternal Grandfather     Social History Social History   Tobacco Use  . Smoking status: Never Smoker  . Smokeless tobacco: Never Used  Substance Use Topics  . Alcohol use: No    Alcohol/week: 0.0 standard drinks  . Drug use: No     Review of Systems  Constitutional: No fever/chills Eyes:  No discharge ENT: No upper respiratory complaints. Respiratory: no cough. No SOB/ use of accessory muscles to breath Gastrointestinal:   No nausea, no vomiting.  No diarrhea.  No constipation. Musculoskeletal: Negative for musculoskeletal pain. Neuro: Patient has headache. Skin: Negative for rash, abrasions, lacerations, ecchymosis.    ____________________________________________   PHYSICAL EXAM:  VITAL SIGNS: ED Triage Vitals  Enc Vitals Group  BP 11/22/20 1529 111/68     Pulse Rate 11/22/20 1529 82     Resp 11/22/20 1529 20     Temp 11/22/20 1529 98.7 F (37.1 C)     Temp Source 11/22/20 1529 Oral     SpO2 11/22/20 1529 100 %     Weight 11/22/20 1529 250 lb (113.4 kg)     Height 11/22/20 1529 5\' 5"  (1.651 m)     Head Circumference --      Peak Flow --      Pain Score 11/22/20 1529 7     Pain Loc --      Pain Edu? --      Excl. in GC? --      Constitutional: Alert and oriented. Well appearing and in no acute distress. Eyes: Conjunctivae are normal. PERRL. EOMI. Head:  Atraumatic. ENT:      Nose: No congestion/rhinnorhea.      Mouth/Throat: Mucous membranes are moist.  Neck: No stridor.  No cervical spine tenderness to palpation. Cardiovascular: Normal rate, regular rhythm. Normal S1 and S2.  Good peripheral circulation. Respiratory: Normal respiratory effort without tachypnea or retractions. Lungs CTAB. Good air entry to the bases with no decreased or absent breath sounds Gastrointestinal: Bowel sounds x 4 quadrants. Soft and nontender to palpation. No guarding or rigidity. No distention. Musculoskeletal: Full range of motion to all extremities. No obvious deformities noted Neurologic:  Normal for age. No gross focal neurologic deficits are appreciated.  Skin:  Skin is warm, dry and intact. No rash noted. Psychiatric: Mood and affect are normal for age. Speech and behavior are normal.   ____________________________________________   LABS (all labs ordered are listed, but only abnormal results are displayed)  Labs Reviewed - No data to display ____________________________________________  EKG   ____________________________________________  RADIOLOGY 11/24/20, personally viewed and evaluated these images (plain radiographs) as part of my medical decision making, as well as reviewing the written report by the radiologist.    No results found.  ____________________________________________    PROCEDURES  Procedure(s) performed:     Procedures     Medications  ketorolac (TORADOL) 30 MG/ML injection 30 mg (30 mg Intravenous Given 11/22/20 1725)  diphenhydrAMINE (BENADRYL) injection 50 mg (50 mg Intravenous Given 11/22/20 1725)  sodium chloride 0.9 % bolus 1,000 mL (1,000 mLs Intravenous New Bag/Given 11/22/20 1724)  metoCLOPramide (REGLAN) injection 10 mg (10 mg Intravenous Given 11/22/20 1725)     ____________________________________________   INITIAL IMPRESSION / ASSESSMENT AND PLAN / ED COURSE  Pertinent labs & imaging  results that were available during my care of the patient were reviewed by me and considered in my medical decision making (see chart for details).      Assessment and Plan:  Migraine 26 year old female presents to the emergency department with acute onset headache that started today.  Patient states that her current migraine feels similar to migraines that she has had in the past.  Vital signs are reassuring at triage and patient had no neurodeficits noted on exam.  Patient was given Benadryl, Toradol and Reglan and she reported that her migraine completely resolved.    ____________________________________________  FINAL CLINICAL IMPRESSION(S) / ED DIAGNOSES  Final diagnoses:  Acute nonintractable headache, unspecified headache type      NEW MEDICATIONS STARTED DURING THIS VISIT:  ED Discharge Orders    None          This chart was dictated using voice recognition software/Dragon. Despite best efforts to proofread,  errors can occur which can change the meaning. Any change was purely unintentional.     Orvil Feil, PA-C 11/22/20 Maureen Chatters    Dionne Bucy, MD 11/22/20 2237

## 2020-11-29 DIAGNOSIS — G43909 Migraine, unspecified, not intractable, without status migrainosus: Secondary | ICD-10-CM | POA: Insufficient documentation

## 2021-01-21 ENCOUNTER — Other Ambulatory Visit: Payer: Self-pay

## 2021-01-21 ENCOUNTER — Encounter (HOSPITAL_COMMUNITY): Payer: Self-pay

## 2021-01-21 ENCOUNTER — Emergency Department (HOSPITAL_COMMUNITY)
Admission: EM | Admit: 2021-01-21 | Discharge: 2021-01-21 | Disposition: A | Discharge status: Home / Self Care | Payer: BC Managed Care – PPO | Attending: Emergency Medicine | Admitting: Emergency Medicine

## 2021-01-21 ENCOUNTER — Emergency Department
Admission: EM | Admit: 2021-01-21 | Discharge: 2021-01-21 | Disposition: A | Discharge status: Home / Self Care | Payer: BC Managed Care – PPO | Attending: Emergency Medicine | Admitting: Emergency Medicine

## 2021-01-21 ENCOUNTER — Emergency Department (HOSPITAL_COMMUNITY)
Admission: EM | Admit: 2021-01-21 | Discharge: 2021-01-21 | Disposition: A | Discharge status: Home / Self Care | Payer: BC Managed Care – PPO | Source: Home / Self Care

## 2021-01-21 ENCOUNTER — Emergency Department (HOSPITAL_COMMUNITY): Payer: BC Managed Care – PPO

## 2021-01-21 ENCOUNTER — Emergency Department: Payer: BC Managed Care – PPO

## 2021-01-21 ENCOUNTER — Encounter (HOSPITAL_COMMUNITY): Payer: Self-pay | Admitting: Emergency Medicine

## 2021-01-21 DIAGNOSIS — K829 Disease of gallbladder, unspecified: Secondary | ICD-10-CM

## 2021-01-21 DIAGNOSIS — R0789 Other chest pain: Secondary | ICD-10-CM | POA: Diagnosis not present

## 2021-01-21 DIAGNOSIS — Z5321 Procedure and treatment not carried out due to patient leaving prior to being seen by health care provider: Secondary | ICD-10-CM | POA: Insufficient documentation

## 2021-01-21 DIAGNOSIS — R0602 Shortness of breath: Secondary | ICD-10-CM | POA: Diagnosis not present

## 2021-01-21 DIAGNOSIS — R1011 Right upper quadrant pain: Secondary | ICD-10-CM

## 2021-01-21 DIAGNOSIS — R079 Chest pain, unspecified: Secondary | ICD-10-CM | POA: Diagnosis not present

## 2021-01-21 DIAGNOSIS — J45909 Unspecified asthma, uncomplicated: Secondary | ICD-10-CM | POA: Insufficient documentation

## 2021-01-21 DIAGNOSIS — R110 Nausea: Secondary | ICD-10-CM | POA: Insufficient documentation

## 2021-01-21 DIAGNOSIS — K219 Gastro-esophageal reflux disease without esophagitis: Secondary | ICD-10-CM | POA: Diagnosis not present

## 2021-01-21 DIAGNOSIS — R109 Unspecified abdominal pain: Secondary | ICD-10-CM | POA: Insufficient documentation

## 2021-01-21 DIAGNOSIS — R1013 Epigastric pain: Secondary | ICD-10-CM | POA: Insufficient documentation

## 2021-01-21 DIAGNOSIS — R1010 Upper abdominal pain, unspecified: Secondary | ICD-10-CM | POA: Diagnosis present

## 2021-01-21 DIAGNOSIS — K389 Disease of appendix, unspecified: Secondary | ICD-10-CM | POA: Insufficient documentation

## 2021-01-21 LAB — COMPREHENSIVE METABOLIC PANEL
ALT: 22 U/L (ref 0–44)
AST: 18 U/L (ref 15–41)
Albumin: 4.1 g/dL (ref 3.5–5.0)
Alkaline Phosphatase: 58 U/L (ref 38–126)
Anion gap: 4 — ABNORMAL LOW (ref 5–15)
BUN: 19 mg/dL (ref 6–20)
CO2: 24 mmol/L (ref 22–32)
Calcium: 9 mg/dL (ref 8.9–10.3)
Chloride: 106 mmol/L (ref 98–111)
Creatinine, Ser: 0.69 mg/dL (ref 0.44–1.00)
GFR, Estimated: >60 mL/min (ref >60)
Glucose, Bld: 99 mg/dL (ref 70–99)
Potassium: 3.9 mmol/L (ref 3.5–5.1)
Sodium: 134 mmol/L — ABNORMAL LOW (ref 135–145)
Total Bilirubin: 1 mg/dL (ref 0.3–1.2)
Total Protein: 7.5 g/dL (ref 6.5–8.1)

## 2021-01-21 LAB — CBC WITH DIFFERENTIAL/PLATELET
Abs Immature Granulocytes: 0.02 10*3/uL (ref 0.00–0.07)
Basophils Absolute: 0.1 10*3/uL (ref 0.0–0.1)
Basophils Relative: 1 %
Eosinophils Absolute: 0.5 10*3/uL (ref 0.0–0.5)
Eosinophils Relative: 6 %
HCT: 37.9 % (ref 36.0–46.0)
Hemoglobin: 12.6 g/dL (ref 12.0–15.0)
Immature Granulocytes: 0 %
Lymphocytes Relative: 36 %
Lymphs Abs: 2.9 10*3/uL (ref 0.7–4.0)
MCH: 29.2 pg (ref 26.0–34.0)
MCHC: 33.2 g/dL (ref 30.0–36.0)
MCV: 87.7 fL (ref 80.0–100.0)
Monocytes Absolute: 0.5 10*3/uL (ref 0.1–1.0)
Monocytes Relative: 6 %
Neutro Abs: 4.2 10*3/uL (ref 1.7–7.7)
Neutrophils Relative %: 51 %
Platelets: 236 10*3/uL (ref 150–400)
RBC: 4.32 MIL/uL (ref 3.87–5.11)
RDW: 13.3 % (ref 11.5–15.5)
WBC: 8.1 10*3/uL (ref 4.0–10.5)
nRBC: 0 % (ref 0.0–0.2)

## 2021-01-21 LAB — URINALYSIS, ROUTINE W REFLEX MICROSCOPIC
Bilirubin Urine: NEGATIVE
Glucose, UA: NEGATIVE mg/dL
Hgb urine dipstick: NEGATIVE
Ketones, ur: NEGATIVE mg/dL
Leukocytes,Ua: NEGATIVE
Nitrite: NEGATIVE
Protein, ur: NEGATIVE mg/dL
Specific Gravity, Urine: >1.046 — ABNORMAL HIGH (ref 1.005–1.030)
pH: 6 (ref 5.0–8.0)

## 2021-01-21 LAB — LIPASE, BLOOD: Lipase: 41 U/L (ref 11–51)

## 2021-01-21 LAB — TROPONIN I (HIGH SENSITIVITY)
Troponin I (High Sensitivity): <2 ng/L (ref <18)
Troponin I (High Sensitivity): <2 ng/L (ref <18)
Troponin I (High Sensitivity): <2 ng/L (ref <18)

## 2021-01-21 LAB — HCG, SERUM, QUALITATIVE: Preg, Serum: NEGATIVE

## 2021-01-21 LAB — D-DIMER, QUANTITATIVE: D-Dimer, Quant: 0.48 ug/mL-FEU (ref 0.00–0.50)

## 2021-01-21 MED ORDER — LIDOCAINE VISCOUS HCL 2 % MT SOLN
15.0000 mL | Freq: Once | OROMUCOSAL | Status: AC
  Administered 2021-01-21: 15 mL via ORAL
  Filled 2021-01-21: qty 15

## 2021-01-21 MED ORDER — ALUM & MAG HYDROXIDE-SIMETH 200-200-20 MG/5ML PO SUSP
30.0000 mL | Freq: Once | ORAL | Status: AC
  Administered 2021-01-21: 30 mL via ORAL
  Filled 2021-01-21: qty 30

## 2021-01-21 MED ORDER — IOHEXOL 300 MG/ML  SOLN
100.0000 mL | Freq: Once | INTRAMUSCULAR | Status: AC | PRN
  Administered 2021-01-21: 100 mL via INTRAVENOUS

## 2021-01-21 NOTE — ED Triage Notes (Signed)
Pt discharged this morning from ED with referral to follow up with GI. Pt c/o continued abd pain.

## 2021-01-21 NOTE — ED Triage Notes (Signed)
Pt states central chest pain that began and 1230 today with associated shob. Pt states it feels like someone is sitting on her.

## 2021-01-21 NOTE — ED Provider Notes (Signed)
Emergency Medicine Provider Triage Evaluation Note  Vanessa Booker , a 25 y.o. female  was evaluated in triage.  Pt complains of chest pain.  Awoke approximately 1.5 hours ago with central chest tightness. Thought it was heartburn and took antacid without relief of symptoms.  Denies recent travel, trauma or hormone use.  Review of Systems  Positive: Chest pain Negative: Cough, shortness of breath  Physical Exam  Ht 5' (1.524 m)   Wt 103.4 kg   BMI 44.53 kg/m  Gen:   Awake, mild distress   Resp:  Normal effort  MSK:   Moves extremities without difficulty  Other:    Medical Decision Making  Medically screening exam initiated at 2:06 AM.  Appropriate orders placed.  Charlita Brian Hlad was informed that the remainder of the evaluation will be completed by another provider, this initial triage assessment does not replace that evaluation, and the importance of remaining in the ED until their evaluation is complete.  25 year old female presenting with chest pain.  Will obtain lab work, chest x-ray.  Awaiting treatment room.   Irean Hong, MD 01/21/21 [phone]

## 2021-01-21 NOTE — ED Provider Notes (Signed)
[facility] EMERGENCY DEPARTMENT Provider Note   CSN: [id number] Arrival date & time: 01/21/21  0455     History Chief Complaint  Patient presents with   Chest Pain    Vanessa Booker is a 25 y.o. female.  Patient woke up at 1 AM with pain in her upper abdomen and chest.  Felt like a pressure.  She went to [facility] and waited several hours but left without being seen.  Arrives here by EMS with still ongoing upper abdominal pain that radiates into her chest.  Nausea but no vomiting.  Received morphine by EMS with some relief.  Was apparently cooled landing on their arrival.  She is never had this pain before.  States she has a history of acid reflux and asthma but this does not feel like acid reflux.  No cough or fever.  No pain with urination or blood in the urine.  No possibility of pregnancy.  No birth control use or other hormone use.  Still has appendix and gallbladder. Felt well when she went to bed last night.  No history of similar pain previously She feels the pain in her abdomen is making her feel like she is short of breath  The history is provided by the patient and the EMS personnel.  Chest Pain Associated symptoms: abdominal pain and nausea   Associated symptoms: no dizziness, no fever, no headache, no shortness of breath, no vomiting and no weakness       Past Medical History:  Diagnosis Date   Asthma    Headaches due to old head injury    History of PFTs sept 2013   normal    Prediabetes    Reflux     Patient Active Problem List   Diagnosis Date Noted   Morbid obesity (HCC) 08/01/2016   Left arm pain 04/15/2016   Adjustment disorder with mixed anxiety and depressed mood 09/13/2013   Folliculitis 07/27/2013   Hidradenitis suppurativa 10/01/2012   Prediabetes 09/21/2012    History reviewed. No pertinent surgical history.   OB History   No obstetric history on file.     Family History  Problem Relation Age of Onset   Diabetes  Mother    Hypertension Mother    Depression Mother    Kidney disease Mother    Anxiety disorder Mother    Cancer Father        passed away from colon cancer   Cancer Maternal Aunt    Anxiety disorder Maternal Aunt    Depression Maternal Aunt    Diabetes Maternal Grandfather    Hypertension Maternal Grandfather    Anxiety disorder Maternal Grandfather    Depression Maternal Grandfather     Social History   Tobacco Use   Smoking status: Never   Smokeless tobacco: Never  Substance Use Topics   Alcohol use: No    Alcohol/week: 0.0 standard drinks   Drug use: No    Home Medications Prior to Admission medications   Medication Sig Start Date End Date Taking? Authorizing Provider  Hyoscyamine Sulfate SL (LEVSIN/SL) 0.125 MG SUBL Place 0.125 mg under the tongue 3 (three) times daily before meals. 03/17/20   Triplett, Rulon Eisenmenger B, FNP  ondansetron (ZOFRAN ODT) 4 MG disintegrating tablet Take 1 tablet (4 mg total) by mouth every 8 (eight) hours as needed for nausea or vomiting. 07/12/20   Tommi Rumps, PA-C  sucralfate (CARAFATE) 1 GM/10ML suspension Take 10 mLs (1 g total) by mouth 4 (four) times  daily. 03/17/20 03/17/21  Chinita Pester, FNP    Allergies    Celexa [citalopram hydrobromide], Metformin, Metformin and related, Shrimp [shellfish allergy], Cefazolin, and Cefprozil  Review of Systems   Review of Systems  Constitutional:  Negative for activity change, appetite change and fever.  HENT:  Negative for congestion and rhinorrhea.   Respiratory:  Positive for chest tightness. Negative for shortness of breath.   Cardiovascular:  Positive for chest pain.  Gastrointestinal:  Positive for abdominal pain and nausea. Negative for vomiting.  Genitourinary:  Negative for dysuria and hematuria.  Musculoskeletal:  Negative for arthralgias and myalgias.  Skin:  Negative for rash.  Neurological:  Negative for dizziness, weakness and headaches.   all other systems are negative except as  noted in the HPI and PMH.   Physical Exam Updated Vital Signs BP 116/77   Pulse 61   Temp 98.1 F (36.7 C) (Oral)   Resp 20   Ht 5\' 5"  (1.651 m)   Wt 99.8 kg   LMP  (LMP Unknown)   SpO2 100%   BMI 36.61 kg/m   Physical Exam Vitals and nursing note reviewed.  Constitutional:      General: She is not in acute distress.    Appearance: She is well-developed. She is obese.  HENT:     Head: Normocephalic and atraumatic.     Mouth/Throat:     Pharynx: No oropharyngeal exudate.  Eyes:     Conjunctiva/sclera: Conjunctivae normal.     Pupils: Pupils are equal, round, and reactive to light.  Neck:     Comments: No meningismus. Cardiovascular:     Rate and Rhythm: Normal rate and regular rhythm.     Heart sounds: Normal heart sounds. No murmur heard. Pulmonary:     Effort: Pulmonary effort is normal. No respiratory distress.     Breath sounds: Normal breath sounds.  Abdominal:     Palpations: Abdomen is soft.     Tenderness: There is abdominal tenderness. There is no guarding or rebound.     Comments: Epigastric and right upper quadrant tenderness, no guarding or rebound  Musculoskeletal:        General: No tenderness. Normal range of motion.     Cervical back: Normal range of motion and neck supple.  Skin:    General: Skin is warm.  Neurological:     Mental Status: She is alert and oriented to person, place, and time.     Cranial Nerves: No cranial nerve deficit.     Motor: No abnormal muscle tone.     Coordination: Coordination normal.     Comments:  5/5 strength throughout. CN 2-12 intact.Equal grip strength.   Psychiatric:        Behavior: Behavior normal.    ED Results / Procedures / Treatments   Labs (all labs ordered are listed, but only abnormal results are displayed) Labs Reviewed  D-DIMER, QUANTITATIVE  URINALYSIS, ROUTINE W REFLEX MICROSCOPIC  HCG, SERUM, QUALITATIVE  TROPONIN I (HIGH SENSITIVITY)  TROPONIN I (HIGH SENSITIVITY)    EKG EKG  Interpretation  Date/Time:  Sunday January 21 2021 05:03:13 EDT Ventricular Rate:  63 PR Interval:  151 QRS Duration: 100 QT Interval:  385 QTC Calculation: 395 R Axis:   57 Text Interpretation: Sinus rhythm No significant change was found Confirmed by 1977 [phone]) on 01/21/2021 5:15:18 AM  Radiology DG Chest 2 View  Result Date: 01/21/2021 CLINICAL DATA:  Central chest pain. EXAM: CHEST - 2 VIEW COMPARISON:  March 12, 2016 FINDINGS: The heart size and mediastinal contours are within normal limits. Both lungs are clear. The visualized skeletal structures are unremarkable. IMPRESSION: No active cardiopulmonary disease. Electronically Signed   By: Aram Candela M.D.   On: 01/21/2021 03:02    Procedures Procedures   Medications Ordered in ED Medications  alum & mag hydroxide-simeth (MAALOX/MYLANTA) 200-200-20 MG/5ML suspension 30 mL (has no administration in time range)    And  lidocaine (XYLOCAINE) 2 % viscous mouth solution 15 mL (has no administration in time range)    ED Course  I have reviewed the triage vital signs and the nursing notes.  Pertinent labs & imaging results that were available during my care of the patient were reviewed by me and considered in my medical decision making (see chart for details).    MDM Rules/Calculators/A&P                          Upper abdominal pain chest since approximately 1 AM.  EKG is sinus rhythm without acute ST abnormalities.  Suspect more gallbladder/pancreas/GI pathology rather than cardiac pain. She had a normal gallbladder ultrasound in September 2021 Labs at outside facility showed normal LFTs, lipase white blood cell count.  Negative troponin.  Chest x-ray was normal.  Initially it was thought ultrasound was not available at this facility today so CT scan was obtained.  This shows no evidence of acute cholecystitis or other acute pathology. Repeat troponin and D-dimer are pending.  Chest x-ray from outside  facility is negative.  Ultrasound will be obtained for further evaluation of right upper quadrant pain and possible gallstones. Care to be transferred at shift change  Final Clinical Impression(s) / ED Diagnoses Final diagnoses:  Atypical chest pain  RUQ pain    Rx / DC Orders ED Discharge Orders     None        Lorraine Cimmino, Jeannett Senior, MD 01/21/21 [phone]

## 2021-01-21 NOTE — Discharge Instructions (Addendum)
There is no evidence of heart attack or blood clot in the lung.   The ultrasound indicates that you may have gallbladder disease causing her discomfort.   Stay on a low-fat diet to help control gallbladder symptoms.   Your doctor can evaluate you further with HIDA scan which can screen for gallbladder function problems.  You could also see a GI doctor, for that evaluation.   Follow-up with your primary doctor.  Avoid alcohol, caffeine, NSAID medications, spicy foods. As we discussed, use your fiber supplement, to help with constipation. Return to the ED with new or worsening symptoms.

## 2021-01-21 NOTE — ED Provider Notes (Addendum)
7:30 AM-checkout from Dr. Rancour to evaluate patient after obtaining D-dimer, and abdominal ultrasound.  10:50 AM-D-dimer normal.  Ultrasound indicates gallbladder sludge.  The patient was at [facility], prior to coming here this morning and had labs done there, CBC, c-Met and lipase, which were normal.  At this time she is alert and comfortable.  Findings discussed with patient and her partner at the bedside.  Patient has had some trouble with constipation recently and is planning on starting a fiber supplement.  She has a PCP for follow-up and I will refer her to GI.  Patient can get further evaluation for gallbladder disease, with a HIDA scan.  She may respond to dietary control, fluids and weight loss.       Wentz, Elliott, MD 01/21/21 1122  

## 2021-01-21 NOTE — ED Triage Notes (Signed)
Pt arrived via [facility] c/o chest pain et RUQ abd pain 10/10. EMS states that pt was pale, cool, clammy upon arrival. Received 4mg  morphine IM at 0422 via EMS, now rating pain 5/10

## 2021-01-21 NOTE — ED Notes (Signed)
Reminded pt I needed a urine sample. . Dimmed lights.

## 2021-01-21 NOTE — ED Notes (Signed)
Took pt to the bathroom to get a urine a=sample

## 2021-01-21 NOTE — ED Notes (Signed)
Patient transported to CT 

## 2021-01-22 ENCOUNTER — Encounter (INDEPENDENT_AMBULATORY_CARE_PROVIDER_SITE_OTHER): Payer: Self-pay | Admitting: *Deleted

## 2021-01-23 ENCOUNTER — Other Ambulatory Visit: Payer: Self-pay

## 2021-01-23 ENCOUNTER — Telehealth: Payer: Self-pay | Admitting: Emergency Medicine

## 2021-01-23 ENCOUNTER — Encounter: Payer: Self-pay | Admitting: *Deleted

## 2021-01-23 ENCOUNTER — Emergency Department
Admission: EM | Admit: 2021-01-23 | Discharge: 2021-01-23 | Disposition: A | Discharge status: Home / Self Care | Payer: BC Managed Care – PPO | Attending: Emergency Medicine | Admitting: Emergency Medicine

## 2021-01-23 DIAGNOSIS — R7989 Other specified abnormal findings of blood chemistry: Secondary | ICD-10-CM | POA: Insufficient documentation

## 2021-01-23 DIAGNOSIS — R1011 Right upper quadrant pain: Secondary | ICD-10-CM | POA: Insufficient documentation

## 2021-01-23 DIAGNOSIS — R112 Nausea with vomiting, unspecified: Secondary | ICD-10-CM | POA: Diagnosis not present

## 2021-01-23 DIAGNOSIS — Z5321 Procedure and treatment not carried out due to patient leaving prior to being seen by health care provider: Secondary | ICD-10-CM | POA: Insufficient documentation

## 2021-01-23 LAB — CBC
HCT: 37 % (ref 36.0–46.0)
Hemoglobin: 12.5 g/dL (ref 12.0–15.0)
MCH: 29.3 pg (ref 26.0–34.0)
MCHC: 33.8 g/dL (ref 30.0–36.0)
MCV: 86.7 fL (ref 80.0–100.0)
Platelets: 251 10*3/uL (ref 150–400)
RBC: 4.27 MIL/uL (ref 3.87–5.11)
RDW: 13.3 % (ref 11.5–15.5)
WBC: 6 10*3/uL (ref 4.0–10.5)
nRBC: 0 % (ref 0.0–0.2)

## 2021-01-23 LAB — COMPREHENSIVE METABOLIC PANEL
ALT: 494 U/L — ABNORMAL HIGH (ref 0–44)
AST: 665 U/L — ABNORMAL HIGH (ref 15–41)
Albumin: 4 g/dL (ref 3.5–5.0)
Alkaline Phosphatase: 111 U/L (ref 38–126)
Anion gap: 8 (ref 5–15)
BUN: 13 mg/dL (ref 6–20)
CO2: 26 mmol/L (ref 22–32)
Calcium: 9.5 mg/dL (ref 8.9–10.3)
Chloride: 103 mmol/L (ref 98–111)
Creatinine, Ser: 0.77 mg/dL (ref 0.44–1.00)
GFR, Estimated: >60 mL/min (ref >60)
Glucose, Bld: 107 mg/dL — ABNORMAL HIGH (ref 70–99)
Potassium: 3.7 mmol/L (ref 3.5–5.1)
Sodium: 137 mmol/L (ref 135–145)
Total Bilirubin: 2.3 mg/dL — ABNORMAL HIGH (ref 0.3–1.2)
Total Protein: 7.7 g/dL (ref 6.5–8.1)

## 2021-01-23 LAB — LIPASE, BLOOD: Lipase: 42 U/L (ref 11–51)

## 2021-01-23 NOTE — ED Triage Notes (Signed)
EMS brings pt in for c/o abd pain; seen for same at [facility]

## 2021-01-23 NOTE — ED Triage Notes (Signed)
Pt brought in via ems from home with right side upper abd pain.  Pt has n/v  pt alert.

## 2021-01-23 NOTE — Telephone Encounter (Signed)
Called patient due to elevated labs and then left without treatment.  She says she is at [facility] right now and they are aware of the labs.

## 2021-01-29 ENCOUNTER — Other Ambulatory Visit (HOSPITAL_COMMUNITY): Payer: Self-pay | Admitting: Family Medicine

## 2021-01-29 DIAGNOSIS — R198 Other specified symptoms and signs involving the digestive system and abdomen: Secondary | ICD-10-CM

## 2021-03-19 ENCOUNTER — Telehealth: Payer: Self-pay | Admitting: Surgery

## 2021-03-19 ENCOUNTER — Other Ambulatory Visit: Payer: Self-pay

## 2021-03-19 ENCOUNTER — Encounter: Payer: Self-pay | Admitting: Surgery

## 2021-03-19 ENCOUNTER — Ambulatory Visit: Payer: BC Managed Care – PPO | Admitting: Surgery

## 2021-03-19 VITALS — BP 121/86 | HR 91 | Temp 98.7°F | Ht 65.0 in | Wt 216.6 lb

## 2021-03-19 DIAGNOSIS — K8050 Calculus of bile duct without cholangitis or cholecystitis without obstruction: Secondary | ICD-10-CM

## 2021-03-19 NOTE — Progress Notes (Signed)
Patient ID: Vanessa Booker, female   DOB: 12-Jun-1996, 25 y.o.   MRN: [id number]  HPI Vanessa Booker is a 25 y.o. female seen in consultation at the request of Ms. Adams FNP.  She comes in with a 10-month history of intermittent right upper quadrant pain and epigastric pain that is worsened with certain meals.  Pain is moderate in nature.  No specific alleviating factors.  Pain lasted for few hours.  She also went to the ED couple months ago thinking this was a heart attack but the work-up was negative.  At that time she did get a CT scan as well as a ultrasound of the right upper quadrant that have personally reviewed showing evidence of sludge and no evidence of cholecystitis.  CT scan did not show any acute intra-abdominal abnormalities.  She was sent home and most recently couple weeks ago she was actually hospitalized in [facility].  And came for the same reasons and was found to have elevated LFTs.  Ultrasound also performed over there showing evidence of debris and questionable stones.  Normal common bile duct.  She was observed and further work-up for hepatitis and neurological disorders were carried down.  Work-up was negative and she had an appropriate normalization of her LFTs. She has an appointment to see a surgeon with [facility] system but it was within 2 months.  He also reports that an Ankle have had gallbladder surgery with good success. She denies any fevers any chills any evidence of biliary obstruction. She is able to perform more than 4 METS of activity without any shortness of breath or chest pain. Recent CBC and CMP from 8 days ago that were completely normal. No prior abdominal operations  HPI  Past Medical History:  Diagnosis Date   Asthma    Headaches due to old head injury    History of PFTs sept 2013   normal    Prediabetes    Reflux     History reviewed. No pertinent surgical history.  Family History  Problem Relation Age of Onset   Diabetes Mother     Hypertension Mother    Depression Mother    Kidney disease Mother    Anxiety disorder Mother    Cancer Father        passed away from colon cancer   Cancer Maternal Aunt    Anxiety disorder Maternal Aunt    Depression Maternal Aunt    Diabetes Maternal Grandfather    Hypertension Maternal Grandfather    Anxiety disorder Maternal Grandfather    Depression Maternal Grandfather     Social History Social History   Tobacco Use   Smoking status: Never   Smokeless tobacco: Never  Substance Use Topics   Alcohol use: No    Alcohol/week: 0.0 standard drinks   Drug use: No    Allergies  Allergen Reactions   Celexa [Citalopram Hydrobromide]     Suicidal ideation   Metformin Hives   Shrimp Extract Allergy Skin Test Anaphylaxis   Metformin And Related Hives   Shrimp [Shellfish Allergy]    Cefazolin Rash    Welps   Cefprozil Rash    Other reaction(s): Other (See Comments) unknown    Current Outpatient Medications  Medication Sig Dispense Refill   ondansetron (ZOFRAN ODT) 4 MG disintegrating tablet Take 1 tablet (4 mg total) by mouth every 8 (eight) hours as needed for nausea or vomiting. 15 tablet 0   oxyCODONE (OXY IR/ROXICODONE) 5 MG immediate release tablet Take  5 mg by mouth every 4 (four) hours as needed for severe pain.     pantoprazole (PROTONIX) 40 MG tablet Take 40 mg by mouth daily.     Hyoscyamine Sulfate SL (LEVSIN/SL) 0.125 MG SUBL Place 0.125 mg under the tongue 3 (three) times daily before meals. 120 tablet 1   No current facility-administered medications for this visit.     Review of Systems Full ROS  was asked and was negative except for the information on the HPI  Physical Exam Blood pressure 121/86, pulse 91, temperature 98.7 F (37.1 C), temperature source Oral, height 5\' 5"  (1.651 m), weight 216 lb 9.6 oz (98.2 kg), SpO2 99 %. CONSTITUTIONAL: NAD EYES: Pupils are equal, round, Sclera are non-icteric. EARS, NOSE, MOUTH AND THROAT: She is wearing a  mask. Hearing is intact to voice. LYMPH NODES:  Lymph nodes in the neck are normal. RESPIRATORY:  Lungs are clear. There is normal respiratory effort, with equal breath sounds bilaterally, and without pathologic use of accessory muscles. CARDIOVASCULAR: Heart is regular without murmurs, gallops, or rubs. GI: The abdomen is soft,Tender RUQ, No peritonitis nondistended. There are no palpable masses. There is no hepatosplenomegaly. There are normal bowel sounds in all quadrants. GU: Rectal deferred.   MUSCULOSKELETAL: Normal muscle strength and tone. No cyanosis or edema.   SKIN: Turgor is good and there are no pathologic skin lesions or ulcers. NEUROLOGIC: Motor and sensation is grossly normal. Cranial nerves are grossly intact. PSYCH:  Oriented to person, place and time. Affect is normal.  Data Reviewed  I have personally reviewed the patient's imaging, laboratory findings and medical records.    Assessment/Plan Vanessa Booker is a 25 year old patient with history of biliary colic and likely history of choledocholithiasis.  Currently she is not jaundice and recent CMP were reassuring.  DisCussed with the detail about my recommendation for cholecystectomy and she is in complete agreement. She would be a good candidate for robotic approach.  We serous to get her surgery soon as possible.  She can do it sometime this week as OR availability allows I discussed the procedure in detail.  The patient was given 22.  We discussed the risks and benefits of a laparoscopic cholecystectomy and possible cholangiogram including, but not limited to bleeding, infection, injury to surrounding structures such as the intestine or liver, bile leak, retained gallstones, need to convert to an open procedure, prolonged diarrhea, blood clots such as  DVT, common bile duct injury, anesthesia risks, and possible need for additional procedures.  The likelihood of improvement in symptoms and return to the patient's  normal status is good. We discussed the typical post-operative recovery course.  A copy of this report was sent to the referring provider  Agricultural engineer, MD FACS General Surgeon 03/19/2021, 9:53 AM

## 2021-03-19 NOTE — Telephone Encounter (Signed)
Patient has been advised of Pre-Admission date/time, COVID Testing date and Surgery date.  Surgery Date: 03/20/21 Preadmission Testing Date: 03/20/21 patient instructed to arrive @9 :30 am since this was an add on for surgery following day.  She is given instructions to be NPO after midnight.  Patient verbalized understanding.    Covid Testing Date: Not needed.

## 2021-03-19 NOTE — Patient Instructions (Addendum)
Our surgery scheduler will call  you within 24-48 hours to schedule your surgery. Please have the Blue surgery sheet available when speaking with her.   Cholelithiasis Cholelithiasis is a disease in which gallstones form in the gallbladder. The gallbladder is an organ that stores bile. Bile is a fluid that helps to digest fats. Gallstones begin as small crystals and can slowly grow into stones. They may cause no symptoms until they block the gallbladder duct, or cystic duct, when the gallbladder tightens (contracts) after food is eaten. This can cause pain and is known as a gallbladder attack, or biliary colic. There are two main types of gallstones: Cholesterol stones. These are the most common type of gallstone. These stones are made of hardened cholesterol and are usually yellow-green in color. Cholesterol is a fat-like substance that is made in the liver. Pigment stones. These are dark in color and are made of a red-yellow substance, called bilirubin,that forms when hemoglobin from red blood cells breaks down. What are the causes? This condition may be caused by an imbalance in the different parts that make bile. This can happen if the bile: Has too much bilirubin. This can happen in certain blood diseases, such as sickle cell anemia. Has too much cholesterol. Does not have enough bile salts. These salts help the body absorb and digest fats. In some cases, this condition can also be caused by the gallbladder not emptying completely or often enough. This is common during pregnancy. What increases the risk? The following factors may make you more likely to develop this condition: Being female. Having multiple pregnancies. Health care providers sometimes advise removing diseased gallbladders before future pregnancies. Eating a diet that is heavy in fried foods, fat, and refined carbohydrates, such as white bread and white rice. Being obese. Being older than age 56. Using medicines that contain  female hormones (estrogen) for a long time. Losing weight quickly. Having a family history of gallstones. Having certain medical problems, such as: Diabetes mellitus. Cystic fibrosis. Crohn's disease. Cirrhosis or other long-term (chronic) liver disease. Certain blood diseases, such as sickle cell anemia or leukemia. What are the signs or symptoms? In many cases, having gallstones causes no symptoms. When you have gallstones but do not have symptoms, you have silent gallstones. If a gallstone blocks your bile duct, it can cause a gallbladder attack. The main symptom of a gallbladder attack is sudden pain in the upper right part of the abdomen. The pain: Usually comes at night or after eating. Can last for one hour or more. Can spread to your right shoulder, back, or chest. Can feel like indigestion. This is discomfort, burning, or fullness in your upper abdomen. If the bile duct is blocked for more than a few hours, it can cause an infection or inflammation of your gallbladder (cholecystitis), liver, or pancreas. This can cause: Nausea or vomiting. Bloating. Pain in your abdomen that lasts for 5 hours or longer. Tenderness in your upper abdomen, often in the upper right section and under your rib cage. Fever or chills. Skin or the white parts of your eyes turning yellow (jaundice). This usually happens when a stone has blocked bile from passing through the common bile duct. Dark urine or light-colored stools. How is this diagnosed? This condition may be diagnosed based on: A physical exam. Your medical history. Ultrasound. CT scan. MRI. You may also have other tests, including: Blood tests to check for signs of an infection or inflammation. Cholescintigraphy, or HIDA scan. This is a  scan of your gallbladder and bile ducts (biliary system) using non-harmful radioactive material and special cameras that can see the radioactive material. Endoscopic retrograde cholangiopancreatogram.  This involves inserting a small tube with a camera on the end (endoscope) through your mouth to look at bile ducts and check for blockages. How is this treated? Treatment for this condition depends on the severity of the condition. Silent gallstones do not need treatment. Treatment may be needed if a blockage causes a gallbladder attack or other symptoms. Treatment may include: Home care, if symptoms are not severe. During a simple gallbladder attack, stop eating and drinking for 12-24 hours (except for water and clear liquids). This helps to "cool down" your gallbladder. After 1 or 2 days, you can start to eat a diet of simple or clear foods, such as broths and crackers. You may also need medicines for pain or nausea or both. If you have cholecystitis and an infection, you will need antibiotics. A hospital stay, if needed for pain control or for cholecystitis with severe infection. Cholecystectomy, or surgery to remove your gallbladder. This is the most common treatment if all other treatments have not worked. Medicines to break up gallstones. These are most effective at treating small gallstones. Medicines may be used for up to 6-12 months. Endoscopic retrograde cholangiopancreatogram. A small basket can be attached to the endoscope and used to capture and remove gallstones, mainly those that are in the common bile duct. Follow these instructions at home: Medicines Take over-the-counter and prescription medicines only as told by your health care provider. If you were prescribed an antibiotic medicine, take it as told by your health care provider. Do not stop taking the antibiotic even if you start to feel better. Ask your health care provider if the medicine prescribed to you requires you to avoid driving or using machinery. Eating and drinking Drink enough fluid to keep your urine pale yellow. This is important during a gallbladder attack. Water and clear liquids are preferred. Follow a healthy  diet. This includes: Reducing fatty foods, such as fried food and foods high in cholesterol. Reducing refined carbohydrates, such as white bread and white rice. Eating more fiber. Aim for foods such as almonds, fruit, and beans. Alcohol use If you drink alcohol: Limit how much you use to: 0-1 drink a day for nonpregnant women. 0-2 drinks a day for men. Be aware of how much alcohol is in your drink. In the [place] one drink equals one 12 oz bottle of beer (355 mL), one 5 oz glass of wine (148 mL), or one 1 oz glass of hard liquor (44 mL). General instructions Do not use any products that contain nicotine or tobacco, such as cigarettes, e-cigarettes, and chewing tobacco. If you need help quitting, ask your health care provider. Maintain a healthy weight. Keep all follow-up visits as told by your health care provider. These may include consultations with a surgeon or specialist. This is important. Where to find more information National Institute of Diabetes and Digestive and Kidney Diseases: www.niddk.nih.gov Contact a health care provider if: You think you have had a gallbladder attack. You have been diagnosed with silent gallstones and you develop pain in your abdomen or indigestion. You begin to have attacks more often. You have dark urine or light-colored stools. Get help right away if: You have pain from a gallbladder attack that lasts for more than 2 hours. You have pain in your abdomen that lasts for more than 5 hours or is getting worse.   You have a fever or chills. You have nausea and vomiting that do not go away. You develop jaundice. Summary Cholelithiasis is a disease in which gallstones form in the gallbladder. This condition may be caused by an imbalance in the different parts that make bile. This can happen if your bile has too much bilirubin or cholesterol, or does not have enough bile salts. Treatment for gallstones depends on the severity of the condition. Silent  gallstones do not need treatment. If gallstones cause a gallbladder attack or other symptoms, treatment usually involves not eating or drinking anything. Treatment may also include pain medicines and antibiotics, and it sometimes includes a hospital stay. Surgery to remove the gallbladder is common if all other treatments have not worked. This information is not intended to replace advice given to you by your health care provider. Make sure you discuss any questions you have with your health care provider. Document Revised: 05/10/2019 Document Reviewed: 05/10/2019 Elsevier Patient Education  2022 [employer] Minimally Invasive Cholecystectomy Minimally invasive cholecystectomy is surgery to remove the gallbladder. The gallbladder is a pear-shaped organ that lies beneath the liver on the right side of the body. The gallbladder stores bile, which is a fluid that helps the body digest fats. Cholecystectomy is often done to treat inflammation of the gallbladder (cholecystitis). This condition is usually caused by a buildup of gallstones (cholelithiasis) in the gallbladder. Gallstones can block the flow of bile, which can result in inflammation and pain. In severe cases, emergency surgery may be required. This procedure is done though small incisions in the abdomen, instead of one large incision. It is also called laparoscopic surgery. A thin scope with a camera (laparoscope) is inserted through one incision. Then surgical instruments are inserted through the other incisions. In some cases, a minimally invasive surgery may need to be changed to a surgery that is done through a larger incision. This is called open surgery. Tell a health care provider about: Any allergies you have. All medicines you are taking, including vitamins, herbs, eye drops, creams, and over-the-counter medicines. Any problems you or family members have had with anesthetic medicines. Any blood disorders you have. Any surgeries you  have had. Any medical conditions you have. Whether you are pregnant or may be pregnant. What are the risks? Generally, this is a safe procedure. However, problems may occur, including: Infection. Bleeding. Allergic reactions to medicines. Damage to nearby structures or organs. A stone remaining in the common bile duct. The common bile duct carries bile from the gallbladder into the small intestine. A bile leak from the cyst duct that is clipped when your gallbladder is removed. What happens before the procedure? Staying hydrated Follow instructions from your health care provider about hydration, which may include: Up to 2 hours before the procedure - you may continue to drink clear liquids, such as water, clear fruit juice, black coffee, and plain tea.  Eating and drinking restrictions Follow instructions from your health care provider about eating and drinking, which may include: 8 hours before the procedure - stop eating heavy meals or foods, such as meat, fried foods, or fatty foods. 6 hours before the procedure - stop eating light meals or foods, such as toast or cereal. 6 hours before the procedure - stop drinking milk or drinks that contain milk. 2 hours before the procedure - stop drinking clear liquids. Medicines Ask your health care provider about: Changing or stopping your regular medicines. This is especially important if you are taking diabetes medicines  or blood thinners. Taking medicines such as aspirin and ibuprofen. These medicines can thin your blood. Do not take these medicines unless your health care provider tells you to take them. Taking over-the-counter medicines, vitamins, herbs, and supplements. General instructions Let your health care provider know if you develop a cold or an infection before surgery. Plan to have someone take you home from the hospital or clinic. If you will be going home right after the procedure, plan to have someone with you for 24  hours. Ask your health care provider: How your surgery site will be marked. What steps will be taken to help prevent infection. These may include: Removing hair at the surgery site. Washing skin with a germ-killing soap. Taking antibiotic medicine. What happens during the procedure?  An IV will be inserted into one of your veins. You will be given one or both of the following: A medicine to help you relax (sedative). A medicine to make you fall asleep (general anesthetic). A breathing tube will be placed in your mouth. Your surgeon will make several small incisions in your abdomen. The laparoscope will be inserted through one of the small incisions. The camera on the laparoscope will send images to a monitor in the operating room. This lets your surgeon see inside your abdomen. A gas will be pumped into your abdomen. This will expand your abdomen to give the surgeon more room to perform the surgery. Other tools that are needed for the procedure will be inserted through the other incisions. The gallbladder will be removed through one of the incisions. Your common bile duct may be examined. If stones are found in the common bile duct, they may be removed. After your gallbladder has been removed, the incisions will be closed with stitches (sutures), staples, or skin glue. Your incisions may be covered with a bandage (dressing). The procedure may vary among health care providers and hospitals. What happens after the procedure? Your blood pressure, heart rate, breathing rate, and blood oxygen level will be monitored until you leave the hospital or clinic. You will be given medicines as needed to control your pain. If you were given a sedative during the procedure, it can affect you for several hours. Do not drive or operate machinery until your health care provider says that it is safe. Summary Minimally invasive cholecystectomy, also called laparoscopic cholecystectomy, is surgery to remove  the gallbladder using small incisions. Tell your health care provider about all the medical conditions you have and all the medicines you are taking for those conditions. Before the procedure, follow instructions about eating or drinking restrictions and changing or stopping medicines. If you were given a sedative during the procedure, it can affect you for several hours. Do not drive or operate machinery until your health care provider says that it is safe. This information is not intended to replace advice given to you by your health care provider. Make sure you discuss any questions you have with your health care provider. Document Revised: 03/22/2019 Document Reviewed: 03/22/2019 Elsevier Patient Education  2022 [employer].

## 2021-03-19 NOTE — H&P (View-Only) (Signed)
Patient ID: [name], female   DOB: 12-Jun-1996, 25 y.o.   MRN: [id number]  HPI SKYE PLAMONDON is a 25 y.o. female seen in consultation at the request of Ms. Adams FNP.  She comes in with a 10-month history of intermittent right upper quadrant pain and epigastric pain that is worsened with certain meals.  Pain is moderate in nature.  No specific alleviating factors.  Pain lasted for few hours.  She also went to the ED couple months ago thinking this was a heart attack but the work-up was negative.  At that time she did get a CT scan as well as a ultrasound of the right upper quadrant that have personally reviewed showing evidence of sludge and no evidence of cholecystitis.  CT scan did not show any acute intra-abdominal abnormalities.  She was sent home and most recently couple weeks ago she was actually hospitalized in [facility].  And came for the same reasons and was found to have elevated LFTs.  Ultrasound also performed over there showing evidence of debris and questionable stones.  Normal common bile duct.  She was observed and further work-up for hepatitis and neurological disorders were carried down.  Work-up was negative and she had an appropriate normalization of her LFTs. She has an appointment to see a surgeon with [facility] but it was within 2 months.  He also reports that an Ankle have had gallbladder surgery with good success. She denies any fevers any chills any evidence of biliary obstruction. She is able to perform more than 4 METS of activity without any shortness of breath or chest pain. Recent CBC and CMP from 8 days ago that were completely normal. No prior abdominal operations  HPI  Past Medical History:  Diagnosis Date   Asthma    Headaches due to old head injury    History of PFTs sept 2013   normal    Prediabetes    Reflux     History reviewed. No pertinent surgical history.  Family History  Problem Relation Age of Onset   Diabetes Mother     Hypertension Mother    Depression Mother    Kidney disease Mother    Anxiety disorder Mother    Cancer Father        passed away from colon cancer   Cancer Maternal Aunt    Anxiety disorder Maternal Aunt    Depression Maternal Aunt    Diabetes Maternal Grandfather    Hypertension Maternal Grandfather    Anxiety disorder Maternal Grandfather    Depression Maternal Grandfather     Social History Social History   Tobacco Use   Smoking status: Never   Smokeless tobacco: Never  Substance Use Topics   Alcohol use: No    Alcohol/week: 0.0 standard drinks   Drug use: No    Allergies  Allergen Reactions   Celexa [Citalopram Hydrobromide]     Suicidal ideation   Metformin Hives   Shrimp Extract Allergy Skin Test Anaphylaxis   Metformin And Related Hives   Shrimp [Shellfish Allergy]    Cefazolin Rash    Welps   Cefprozil Rash    Other reaction(s): Other (See Comments) unknown    Current Outpatient Medications  Medication Sig Dispense Refill   ondansetron (ZOFRAN ODT) 4 MG disintegrating tablet Take 1 tablet (4 mg total) by mouth every 8 (eight) hours as needed for nausea or vomiting. 15 tablet 0   oxyCODONE (OXY IR/ROXICODONE) 5 MG immediate release tablet Take  5 mg by mouth every 4 (four) hours as needed for severe pain.     pantoprazole (PROTONIX) 40 MG tablet Take 40 mg by mouth daily.     Hyoscyamine Sulfate SL (LEVSIN/SL) 0.125 MG SUBL Place 0.125 mg under the tongue 3 (three) times daily before meals. 120 tablet 1   No current facility-administered medications for this visit.     Review of Systems Full ROS  was asked and was negative except for the information on the HPI  Physical Exam Blood pressure 121/86, pulse 91, temperature 98.7 F (37.1 C), temperature source Oral, height 5\' 5"  (1.651 m), weight 216 lb 9.6 oz (98.2 kg), SpO2 99 %. CONSTITUTIONAL: NAD EYES: Pupils are equal, round, Sclera are non-icteric. EARS, NOSE, MOUTH AND THROAT: She is wearing a  mask. Hearing is intact to voice. LYMPH NODES:  Lymph nodes in the neck are normal. RESPIRATORY:  Lungs are clear. There is normal respiratory effort, with equal breath sounds bilaterally, and without pathologic use of accessory muscles. CARDIOVASCULAR: Heart is regular without murmurs, gallops, or rubs. GI: The abdomen is soft,Tender RUQ, No peritonitis nondistended. There are no palpable masses. There is no hepatosplenomegaly. There are normal bowel sounds in all quadrants. GU: Rectal deferred.   MUSCULOSKELETAL: Normal muscle strength and tone. No cyanosis or edema.   SKIN: Turgor is good and there are no pathologic skin lesions or ulcers. NEUROLOGIC: Motor and sensation is grossly normal. Cranial nerves are grossly intact. PSYCH:  Oriented to person, place and time. Affect is normal.  Data Reviewed  I have personally reviewed the patient's imaging, laboratory findings and medical records.    Assessment/Plan Josefine is a 25 year old patient with history of biliary colic and likely history of choledocholithiasis.  Currently she is not jaundice and recent CMP were reassuring.  DisCussed with the detail about my recommendation for cholecystectomy and she is in complete agreement. She would be a good candidate for robotic approach.  We serous to get her surgery soon as possible.  She can do it sometime this week as OR availability allows I discussed the procedure in detail.  The patient was given 25.  We discussed the risks and benefits of a laparoscopic cholecystectomy and possible cholangiogram including, but not limited to bleeding, infection, injury to surrounding structures such as the intestine or liver, bile leak, retained gallstones, need to convert to an open procedure, prolonged diarrhea, blood clots such as  DVT, common bile duct injury, anesthesia risks, and possible need for additional procedures.  The likelihood of improvement in symptoms and return to the patient's  normal status is good. We discussed the typical post-operative recovery course.  A copy of this report was sent to the referring provider  Agricultural engineer, MD FACS General Surgeon 03/19/2021, 9:53 AM

## 2021-03-20 ENCOUNTER — Ambulatory Visit: Payer: BC Managed Care – PPO | Admitting: Anesthesiology

## 2021-03-20 ENCOUNTER — Ambulatory Visit
Admission: RE | Admit: 2021-03-20 | Discharge: 2021-03-20 | Disposition: A | Discharge status: Home / Self Care | Payer: BC Managed Care – PPO | Attending: Surgery | Admitting: Surgery

## 2021-03-20 ENCOUNTER — Encounter: Payer: Self-pay | Admitting: Surgery

## 2021-03-20 ENCOUNTER — Other Ambulatory Visit: Payer: Self-pay

## 2021-03-20 ENCOUNTER — Encounter
Admission: RE | Disposition: A | Discharge status: Home / Self Care | Payer: Self-pay | Source: Home / Self Care | Attending: Surgery

## 2021-03-20 DIAGNOSIS — Z888 Allergy status to other drugs, medicaments and biological substances status: Secondary | ICD-10-CM | POA: Diagnosis not present

## 2021-03-20 DIAGNOSIS — Z91013 Allergy to seafood: Secondary | ICD-10-CM | POA: Insufficient documentation

## 2021-03-20 DIAGNOSIS — K8050 Calculus of bile duct without cholangitis or cholecystitis without obstruction: Secondary | ICD-10-CM

## 2021-03-20 DIAGNOSIS — K8064 Calculus of gallbladder and bile duct with chronic cholecystitis without obstruction: Secondary | ICD-10-CM | POA: Insufficient documentation

## 2021-03-20 DIAGNOSIS — Z79899 Other long term (current) drug therapy: Secondary | ICD-10-CM | POA: Insufficient documentation

## 2021-03-20 DIAGNOSIS — Z87892 Personal history of anaphylaxis: Secondary | ICD-10-CM | POA: Diagnosis not present

## 2021-03-20 DIAGNOSIS — Z881 Allergy status to other antibiotic agents status: Secondary | ICD-10-CM | POA: Diagnosis not present

## 2021-03-20 DIAGNOSIS — K811 Chronic cholecystitis: Secondary | ICD-10-CM

## 2021-03-20 SURGERY — CHOLECYSTECTOMY, ROBOT-ASSISTED, LAPAROSCOPIC
Anesthesia: General

## 2021-03-20 MED ORDER — BUPIVACAINE LIPOSOME 1.3 % IJ SUSP
INTRAMUSCULAR | Status: DC | PRN
  Administered 2021-03-20: 20 mL

## 2021-03-20 MED ORDER — ROCURONIUM BROMIDE 10 MG/ML (PF) SYRINGE
PREFILLED_SYRINGE | INTRAVENOUS | Status: AC
  Filled 2021-03-20: qty 10

## 2021-03-20 MED ORDER — CHLORHEXIDINE GLUCONATE CLOTH 2 % EX PADS: 6.0000 | MEDICATED_PAD | Freq: Once | CUTANEOUS | Status: DC

## 2021-03-20 MED ORDER — GABAPENTIN 300 MG PO CAPS
ORAL_CAPSULE | ORAL | Status: AC
  Administered 2021-03-20: 300 mg via ORAL
  Filled 2021-03-20: qty 1

## 2021-03-20 MED ORDER — INDOCYANINE GREEN 25 MG IV SOLR
5.0000 mg | Freq: Once | INTRAVENOUS | Status: AC
  Administered 2021-03-20: 5 mg via INTRAVENOUS
  Filled 2021-03-20: qty 2

## 2021-03-20 MED ORDER — MIDAZOLAM HCL 2 MG/2ML IJ SOLN
INTRAMUSCULAR | Status: DC | PRN
  Administered 2021-03-20: 2 mg via INTRAVENOUS

## 2021-03-20 MED ORDER — PHENYLEPHRINE HCL (PRESSORS) 10 MG/ML IV SOLN
INTRAVENOUS | Status: DC | PRN
  Administered 2021-03-20: 250 ug via INTRAVENOUS
  Administered 2021-03-20: 200 ug via INTRAVENOUS

## 2021-03-20 MED ORDER — OXYCODONE HCL 5 MG PO TABS
ORAL_TABLET | ORAL | Status: AC
  Filled 2021-03-20: qty 1

## 2021-03-20 MED ORDER — SUCCINYLCHOLINE CHLORIDE 200 MG/10ML IV SOSY
PREFILLED_SYRINGE | INTRAVENOUS | Status: AC
  Filled 2021-03-20: qty 10

## 2021-03-20 MED ORDER — CLINDAMYCIN PHOSPHATE 900 MG/50ML IV SOLN
INTRAVENOUS | Status: AC
  Filled 2021-03-20: qty 50

## 2021-03-20 MED ORDER — LIDOCAINE HCL (CARDIAC) PF 100 MG/5ML IV SOSY
PREFILLED_SYRINGE | INTRAVENOUS | Status: DC | PRN
  Administered 2021-03-20: 100 mg via INTRAVENOUS

## 2021-03-20 MED ORDER — ACETAMINOPHEN 500 MG PO TABS
ORAL_TABLET | ORAL | Status: AC
  Administered 2021-03-20: 1000 mg via ORAL
  Filled 2021-03-20: qty 2

## 2021-03-20 MED ORDER — KETOROLAC TROMETHAMINE 30 MG/ML IJ SOLN
INTRAMUSCULAR | Status: DC | PRN
  Administered 2021-03-20: 30 mg via INTRAVENOUS

## 2021-03-20 MED ORDER — BUPIVACAINE-EPINEPHRINE (PF) 0.25% -1:200000 IJ SOLN
INTRAMUSCULAR | Status: AC
  Filled 2021-03-20: qty 30

## 2021-03-20 MED ORDER — CLINDAMYCIN PHOSPHATE 900 MG/50ML IV SOLN
900.0000 mg | INTRAVENOUS | Status: AC
  Administered 2021-03-20: 900 mg via INTRAVENOUS

## 2021-03-20 MED ORDER — DEXAMETHASONE SODIUM PHOSPHATE 10 MG/ML IJ SOLN
INTRAMUSCULAR | Status: AC
  Filled 2021-03-20: qty 1

## 2021-03-20 MED ORDER — ACETAMINOPHEN 500 MG PO TABS: 1000.0000 mg | ORAL_TABLET | ORAL | Status: AC

## 2021-03-20 MED ORDER — FENTANYL CITRATE (PF) 100 MCG/2ML IJ SOLN
INTRAMUSCULAR | Status: AC
  Filled 2021-03-20: qty 2

## 2021-03-20 MED ORDER — ONDANSETRON HCL 4 MG/2ML IJ SOLN
INTRAMUSCULAR | Status: AC
  Filled 2021-03-20: qty 2

## 2021-03-20 MED ORDER — FENTANYL CITRATE (PF) 100 MCG/2ML IJ SOLN
INTRAMUSCULAR | Status: AC
  Administered 2021-03-20: 25 ug via INTRAVENOUS
  Filled 2021-03-20: qty 2

## 2021-03-20 MED ORDER — BUPIVACAINE-EPINEPHRINE (PF) 0.25% -1:200000 IJ SOLN
INTRAMUSCULAR | Status: DC | PRN
  Administered 2021-03-20: 30 mL

## 2021-03-20 MED ORDER — CELECOXIB 200 MG PO CAPS
ORAL_CAPSULE | ORAL | Status: AC
  Administered 2021-03-20: 200 mg via ORAL
  Filled 2021-03-20: qty 1

## 2021-03-20 MED ORDER — LIDOCAINE HCL (PF) 2 % IJ SOLN
INTRAMUSCULAR | Status: AC
  Filled 2021-03-20: qty 5

## 2021-03-20 MED ORDER — EPHEDRINE 5 MG/ML INJ
INTRAVENOUS | Status: AC
  Filled 2021-03-20: qty 5

## 2021-03-20 MED ORDER — PROPOFOL 10 MG/ML IV BOLUS
INTRAVENOUS | Status: AC
  Filled 2021-03-20: qty 20

## 2021-03-20 MED ORDER — EPHEDRINE SULFATE 50 MG/ML IJ SOLN
INTRAMUSCULAR | Status: DC | PRN
  Administered 2021-03-20: 10 mg via INTRAVENOUS

## 2021-03-20 MED ORDER — FENTANYL CITRATE (PF) 100 MCG/2ML IJ SOLN
25.0000 ug | INTRAMUSCULAR | Status: DC | PRN
  Administered 2021-03-20: 50 ug via INTRAVENOUS

## 2021-03-20 MED ORDER — OXYCODONE HCL 5 MG/5ML PO SOLN
5.0000 mg | Freq: Once | ORAL | Status: AC | PRN
Start: 2021-03-20 — End: 2021-03-20

## 2021-03-20 MED ORDER — OXYCODONE-ACETAMINOPHEN 5-325 MG PO TABS: 1.0000 | ORAL_TABLET | ORAL | 0 refills | Status: DC | PRN

## 2021-03-20 MED ORDER — CHLORHEXIDINE GLUCONATE 0.12 % MT SOLN
OROMUCOSAL | Status: AC
  Administered 2021-03-20: 15 mL
  Filled 2021-03-20: qty 15

## 2021-03-20 MED ORDER — OXYCODONE HCL 5 MG PO TABS
5.0000 mg | ORAL_TABLET | Freq: Once | ORAL | Status: AC | PRN
  Administered 2021-03-20: 5 mg via ORAL

## 2021-03-20 MED ORDER — ROCURONIUM BROMIDE 100 MG/10ML IV SOLN
INTRAVENOUS | Status: DC | PRN
  Administered 2021-03-20: 50 mg via INTRAVENOUS

## 2021-03-20 MED ORDER — PROPOFOL 10 MG/ML IV BOLUS
INTRAVENOUS | Status: DC | PRN
  Administered 2021-03-20: 180 mg via INTRAVENOUS

## 2021-03-20 MED ORDER — LACTATED RINGERS IV SOLN: INTRAVENOUS | Status: DC | PRN

## 2021-03-20 MED ORDER — MIDAZOLAM HCL 2 MG/2ML IJ SOLN
INTRAMUSCULAR | Status: AC
  Filled 2021-03-20: qty 2

## 2021-03-20 MED ORDER — SUGAMMADEX SODIUM 200 MG/2ML IV SOLN
INTRAVENOUS | Status: DC | PRN
  Administered 2021-03-20: 200 mg via INTRAVENOUS

## 2021-03-20 MED ORDER — DEXAMETHASONE SODIUM PHOSPHATE 10 MG/ML IJ SOLN
INTRAMUSCULAR | Status: DC | PRN
  Administered 2021-03-20: 5 mg via INTRAVENOUS

## 2021-03-20 MED ORDER — ONDANSETRON HCL 4 MG/2ML IJ SOLN
INTRAMUSCULAR | Status: DC | PRN
  Administered 2021-03-20: 4 mg via INTRAVENOUS

## 2021-03-20 MED ORDER — SUCCINYLCHOLINE CHLORIDE 200 MG/10ML IV SOSY
PREFILLED_SYRINGE | INTRAVENOUS | Status: DC | PRN
  Administered 2021-03-20: 120 mg via INTRAVENOUS

## 2021-03-20 MED ORDER — ONDANSETRON HCL 4 MG/2ML IJ SOLN
4.0000 mg | Freq: Once | INTRAMUSCULAR | Status: AC | PRN
  Administered 2021-03-20: 4 mg via INTRAVENOUS

## 2021-03-20 MED ORDER — CELECOXIB 200 MG PO CAPS: 200.0000 mg | ORAL_CAPSULE | ORAL | Status: AC

## 2021-03-20 MED ORDER — DEXMEDETOMIDINE (PRECEDEX) IN NS 20 MCG/5ML (4 MCG/ML) IV SYRINGE
PREFILLED_SYRINGE | INTRAVENOUS | Status: DC | PRN
  Administered 2021-03-20: 8 ug via INTRAVENOUS

## 2021-03-20 MED ORDER — FENTANYL CITRATE (PF) 100 MCG/2ML IJ SOLN
INTRAMUSCULAR | Status: DC | PRN
  Administered 2021-03-20 (×2): 25 ug via INTRAVENOUS
  Administered 2021-03-20: 50 ug via INTRAVENOUS

## 2021-03-20 MED ORDER — BUPIVACAINE LIPOSOME 1.3 % IJ SUSP
INTRAMUSCULAR | Status: AC
  Filled 2021-03-20: qty 20

## 2021-03-20 MED ORDER — GABAPENTIN 300 MG PO CAPS: 300.0000 mg | ORAL_CAPSULE | ORAL | Status: AC

## 2021-03-20 SURGICAL SUPPLY — 53 items
ADH SKN CLS APL DERMABOND .7 (GAUZE/BANDAGES/DRESSINGS) ×2
APL PRP STRL LF DISP 70% ISPRP (MISCELLANEOUS) ×2
BAG SPEC RTRVL LRG 6X4 10 (ENDOMECHANICALS) ×2
CANNULA REDUC XI 12-8 STAPL (CANNULA) ×3
CANNULA REDUCER 12-8 DVNC XI (CANNULA) ×2 IMPLANT
CHLORAPREP W/TINT 26 (MISCELLANEOUS) ×3 IMPLANT
CLIP LIGATING HEMO O LOK GREEN (MISCELLANEOUS) ×3 IMPLANT
DECANTER SPIKE VIAL GLASS SM (MISCELLANEOUS) IMPLANT
DEFOGGER SCOPE WARMER CLEARIFY (MISCELLANEOUS) ×3 IMPLANT
DERMABOND ADVANCED (GAUZE/BANDAGES/DRESSINGS) ×1
DERMABOND ADVANCED .7 DNX12 (GAUZE/BANDAGES/DRESSINGS) ×2 IMPLANT
DRAPE ARM DVNC X/XI (DISPOSABLE) ×8 IMPLANT
DRAPE COLUMN DVNC XI (DISPOSABLE) ×2 IMPLANT
DRAPE DA VINCI XI ARM (DISPOSABLE) ×12
DRAPE DA VINCI XI COLUMN (DISPOSABLE) ×3
ELECT CAUTERY BLADE 6.4 (BLADE) ×3 IMPLANT
ELECT REM PT RETURN 9FT ADLT (ELECTROSURGICAL) ×3
ELECTRODE REM PT RTRN 9FT ADLT (ELECTROSURGICAL) ×2 IMPLANT
GAUZE 4X4 16PLY ~~LOC~~+RFID DBL (SPONGE) IMPLANT
GLOVE SURG ENC MOIS LTX SZ7 (GLOVE) ×12 IMPLANT
GOWN STRL REUS W/ TWL LRG LVL3 (GOWN DISPOSABLE) ×8 IMPLANT
GOWN STRL REUS W/TWL LRG LVL3 (GOWN DISPOSABLE) ×12
IRRIGATION STRYKERFLOW (MISCELLANEOUS) IMPLANT
IRRIGATOR STRYKERFLOW (MISCELLANEOUS)
IV NS 1000ML (IV SOLUTION)
IV NS 1000ML BAXH (IV SOLUTION) IMPLANT
KIT PINK PAD W/HEAD ARE REST (MISCELLANEOUS) ×3
KIT PINK PAD W/HEAD ARM REST (MISCELLANEOUS) ×2 IMPLANT
LABEL OR SOLS (LABEL) ×3 IMPLANT
MANIFOLD NEPTUNE II (INSTRUMENTS) ×3 IMPLANT
NEEDLE HYPO 22GX1.5 SAFETY (NEEDLE) ×3 IMPLANT
NS IRRIG 500ML POUR BTL (IV SOLUTION) ×3 IMPLANT
OBTURATOR OPTICAL STANDARD 8MM (TROCAR) ×3
OBTURATOR OPTICAL STND 8 DVNC (TROCAR) ×2
OBTURATOR OPTICALSTD 8 DVNC (TROCAR) ×2 IMPLANT
PACK LAP CHOLECYSTECTOMY (MISCELLANEOUS) ×3 IMPLANT
PENCIL ELECTRO HAND CTR (MISCELLANEOUS) ×3 IMPLANT
POUCH SPECIMEN RETRIEVAL 10MM (ENDOMECHANICALS) ×3 IMPLANT
SEAL CANN UNIV 5-8 DVNC XI (MISCELLANEOUS) ×6 IMPLANT
SEAL XI 5MM-8MM UNIVERSAL (MISCELLANEOUS) ×9
SET TUBE SMOKE EVAC HIGH FLOW (TUBING) ×3 IMPLANT
SOLUTION ELECTROLUBE (MISCELLANEOUS) ×3 IMPLANT
SPONGE T-LAP 18X18 ~~LOC~~+RFID (SPONGE) ×3 IMPLANT
SPONGE T-LAP 4X18 ~~LOC~~+RFID (SPONGE) IMPLANT
STAPLER CANNULA SEAL DVNC XI (STAPLE) ×2 IMPLANT
STAPLER CANNULA SEAL XI (STAPLE) ×3
SUT MNCRL AB 4-0 PS2 18 (SUTURE) ×3 IMPLANT
SUT VICRYL 0 AB UR-6 (SUTURE) ×6 IMPLANT
SYR 20ML LL LF (SYRINGE) ×3 IMPLANT
SYR 30ML LL (SYRINGE) ×3 IMPLANT
TAPE TRANSPORE STRL 2 31045 (GAUZE/BANDAGES/DRESSINGS) ×3 IMPLANT
TROCAR BALLN GELPORT 12X130M (ENDOMECHANICALS) ×3 IMPLANT
WATER STERILE IRR 500ML POUR (IV SOLUTION) IMPLANT

## 2021-03-20 NOTE — Transfer of Care (Signed)
Immediate Anesthesia Transfer of Care Note  Patient: Vanessa Booker  Procedure(s) Performed: XI ROBOTIC ASSISTED LAPAROSCOPIC CHOLECYSTECTOMY INDOCYANINE GREEN FLUORESCENCE IMAGING (ICG)  Patient Location: PACU  Anesthesia Type:General  Level of Consciousness: awake and alert   Airway & Oxygen Therapy: Patient Spontanous Breathing and Patient connected to face mask oxygen  Post-op Assessment: Report given to RN and Post -op Vital signs reviewed and stable  Post vital signs: Reviewed and stable  Last Vitals:  Vitals Value Taken Time  BP 127/75 03/20/21 1423  Temp    Pulse 78 03/20/21 1425  Resp 19 03/20/21 1425  SpO2 100 % 03/20/21 1425  Vitals shown include unvalidated device data.  Last Pain:  Vitals:   03/20/21 1024  TempSrc: Oral  PainSc: 3       Patients Stated Pain Goal: 0 (03/20/21 1012)  Complications: No notable events documented.

## 2021-03-20 NOTE — Op Note (Signed)
Robotic assisted laparoscopic Cholecystectomy  Pre-operative Diagnosis: chronic cholecystitis  Post-operative Diagnosis: same  Procedure:  Robotic assisted laparoscopic Cholecystectomy  Surgeon: Sterling Big, MD FACS  Anesthesia: Gen. with endotracheal tube  Findings: Chronic  Cholecystitis   Estimated Blood Loss: 5cc       Specimens: Gallbladder           Complications: none   Procedure Details  The patient was seen again in the Holding Room. The benefits, complications, treatment options, and expected outcomes were discussed with the patient. The risks of bleeding, infection, recurrence of symptoms, failure to resolve symptoms, bile duct damage, bile duct leak, retained common bile duct stone, bowel injury, any of which could require further surgery and/or ERCP, stent, or papillotomy were reviewed with the patient. The likelihood of improving the patient's symptoms with return to their baseline status is good.  The patient and/or family concurred with the proposed plan, giving informed consent.  The patient was taken to Operating Room, identified  and the procedure verified as Laparoscopic Cholecystectomy.  A Time Out was held and the above information confirmed.  Prior to the induction of general anesthesia, antibiotic prophylaxis was administered. VTE prophylaxis was in place. General endotracheal anesthesia was then administered and tolerated well. After the induction, the abdomen was prepped with Chloraprep and draped in the sterile fashion. The patient was positioned in the supine position.  Cut down technique was used to enter the abdominal cavity and a Hasson trochar was placed after two vicryl stitches were anchored to the fascia. Pneumoperitoneum was then created with CO2 and tolerated well without any adverse changes in the patient's vital signs.  Three 8-mm ports were placed under direct vision. All skin incisions  were infiltrated with a local anesthetic agent before making  the incision and placing the trocars.   The patient was positioned  in reverse Trendelenburg, robot was brought to the surgical field and docked in the standard fashion.  We made sure all the instrumentation was kept indirect view at all times and that there were no collision between the arms. I scrubbed out and went to the console.  The gallbladder was identified, the fundus grasped and retracted cephalad. Adhesions were lysed bluntly. The infundibulum was grasped and retracted laterally, exposing the peritoneum overlying the triangle of Calot. This was then divided and exposed in a blunt fashion. An extended critical view of the cystic duct and cystic artery was obtained.  The cystic duct was clearly identified and bluntly dissected.   Artery and duct were double clipped and divided. Using ICG cholangiography we visualize the cystic duct and CBD, no evidence of bile injuries encountered. The gallbladder was taken from the gallbladder fossa in a retrograde fashion with the electrocautery.  Hemostasis was achieved with the electrocautery.Inspection of the right upper quadrant was performed. No bleeding, bile duct injury or leak, or bowel injury was noted. Robotic instruments and robotic arms were undocked in the standard fashion.  I scrubbed back in.  The gallbladder was removed and placed in an Endocatch bag.   Pneumoperitoneum was released.  The periumbilical port site was closed with interrumpted 0 Vicryl sutures. 4-0 subcuticular Monocryl was used to close the skin. Dermabond was  applied.  The patient was then extubated and brought to the recovery room in stable condition. Sponge, lap, and needle counts were correct at closure and at the conclusion of the case.               Sterling Big, MD, FACS

## 2021-03-20 NOTE — Anesthesia Preprocedure Evaluation (Signed)
Anesthesia Evaluation  Patient identified by MRN, date of birth, ID band Patient awake    Reviewed: Allergy & Precautions, H&P , NPO status , Patient's Chart, lab work & pertinent test results, reviewed documented beta blocker date and time   Airway Mallampati: II  TM Distance: >3 FB Neck ROM: full    Dental  (+) Teeth Intact   Pulmonary asthma ,    Pulmonary exam normal        Cardiovascular Exercise Tolerance: Good negative cardio ROS Normal cardiovascular exam Rhythm:regular Rate:Normal     Neuro/Psych  Headaches, PSYCHIATRIC DISORDERS    GI/Hepatic negative GI ROS, Neg liver ROS,   Endo/Other  negative endocrine ROS  Renal/GU negative Renal ROS  negative genitourinary   Musculoskeletal   Abdominal   Peds  Hematology negative hematology ROS (+)   Anesthesia Other Findings Past Medical History: No date: Asthma No date: Headaches due to old head injury sept 2013: History of PFTs     Comment:  normal  No date: Prediabetes No date: Reflux History reviewed. No pertinent surgical history. BMI    Body Mass Index: 34.78 kg/m     Reproductive/Obstetrics negative OB ROS                             Anesthesia Physical Anesthesia Plan  ASA: 2  Anesthesia Plan: General ETT   Post-op Pain Management:    Induction:   PONV Risk Score and Plan: 4 or greater  Airway Management Planned:   Additional Equipment:   Intra-op Plan:   Post-operative Plan:   Informed Consent: I have reviewed the patients History and Physical, chart, labs and discussed the procedure including the risks, benefits and alternatives for the proposed anesthesia with the patient or authorized representative who has indicated his/her understanding and acceptance.     Dental Advisory Given  Plan Discussed with: CRNA  Anesthesia Plan Comments:         Anesthesia Quick Evaluation

## 2021-03-20 NOTE — Discharge Instructions (Addendum)
Laparoscopic Cholecystectomy, Care After  ° °These instructions give you information on caring for yourself after your procedure. Your doctor may also give you more specific instructions. Call your doctor if you have any problems or questions after your procedure.  °HOME CARE  °Change your bandages (dressings) as told by your doctor.  °Keep the wound dry and clean. Wash the wound gently with soap and water. Pat the wound dry with a clean towel.  °Do not take baths, swim, or use hot tubs for 2 weeks, or as told by your doctor.  °Only take medicine as told by your doctor.  °Eat a normal diet as told by your doctor.  °Do not lift anything heavier than 10 pounds (4.5 kg) until your doctor says it is okay.  °Do not play contact sports for 1 week, or as told by your doctor. °GET HELP IF:  °Your wound is red, puffy (swollen), or painful.  °You have yellowish-white fluid (pus) coming from the wound.  °You have fluid draining from the wound for more than 1 day.  °You have a bad smell coming from the wound.  °Your wound breaks open. °GET HELP RIGHT AWAY IF:  °You have trouble breathing.  °You have chest pain.  °You have a fever >101  °You have pain in the shoulders (shoulder strap areas) that is getting worse.  °You feel dizzy or pass out (faint).  °You have severe belly (abdominal) pain.  °You feel sick to your stomach (nauseous) or throw up (vomit) for more than 1 day. ° °AMBULATORY SURGERY  °DISCHARGE INSTRUCTIONS ° ° °The drugs that you were given will stay in your system until tomorrow so for the next 24 hours you should not: ° °Drive an automobile °Make any legal decisions °Drink any alcoholic beverage ° ° °You may resume regular meals tomorrow.  Today it is better to start with liquids and gradually work up to solid foods. ° °You may eat anything you prefer, but it is better to start with liquids, then soup and crackers, and gradually work up to solid foods. ° ° °Please notify your doctor immediately if you have any  unusual bleeding, trouble breathing, redness and pain at the surgery site, drainage, fever, or pain not relieved by medication. ° ° ° °Additional Instructions: °Please contact your physician with any problems or Same Day Surgery at [phone], Monday through Friday 6 am to 4 pm, or [facility] Main number at [phone].  °  °

## 2021-03-20 NOTE — Anesthesia Procedure Notes (Signed)
Procedure Name: Intubation Date/Time: 03/20/2021 1:07 PM Performed by: Fredderick Phenix, CRNA Pre-anesthesia Checklist: Patient identified, Emergency Drugs available, Suction available and Patient being monitored Patient Re-evaluated:Patient Re-evaluated prior to induction Oxygen Delivery Method: Circle system utilized Preoxygenation: Pre-oxygenation with 100% oxygen Induction Type: IV induction Ventilation: Mask ventilation without difficulty Laryngoscope Size: Mac and 4 Grade View: Grade I Tube type: Oral Tube size: 7.0 mm Number of attempts: 1 Airway Equipment and Method: Stylet and Oral airway Placement Confirmation: ETT inserted through vocal cords under direct vision, positive ETCO2 and breath sounds checked- equal and bilateral Secured at: 21 cm Tube secured with: Tape Dental Injury: Teeth and Oropharynx as per pre-operative assessment

## 2021-03-20 NOTE — Interval H&P Note (Signed)
History and Physical Interval Note:  03/20/2021 12:16 PM  Vanessa Booker  has presented today for surgery, with the diagnosis of biliary colic.  The various methods of treatment have been discussed with the patient and family. After consideration of risks, benefits and other options for treatment, the patient has consented to  Procedure(s): XI ROBOTIC ASSISTED LAPAROSCOPIC CHOLECYSTECTOMY (N/A) as a surgical intervention.  The patient's history has been reviewed, patient examined, no change in status, stable for surgery.  I have reviewed the patient's chart and labs.  Questions were answered to the patient's satisfaction.     Rhaya Coale F Connery Shiffler

## 2021-03-22 LAB — SURGICAL PATHOLOGY

## 2021-03-26 ENCOUNTER — Telehealth: Payer: Self-pay | Admitting: Surgery

## 2021-03-26 NOTE — Telephone Encounter (Signed)
Patient said she hasn't had a bowel movement since last Sunday and if she uses the bathroom she is losing a lot blood. Please call patient and advise.

## 2021-03-26 NOTE — Anesthesia Postprocedure Evaluation (Signed)
Anesthesia Post Note  Patient: Vanessa Booker  Procedure(s) Performed: XI ROBOTIC ASSISTED LAPAROSCOPIC CHOLECYSTECTOMY INDOCYANINE GREEN FLUORESCENCE IMAGING (ICG)  Patient location during evaluation: PACU Anesthesia Type: General Level of consciousness: awake and alert Pain management: pain level controlled Vital Signs Assessment: post-procedure vital signs reviewed and stable Respiratory status: spontaneous breathing, nonlabored ventilation, respiratory function stable and patient connected to nasal cannula oxygen Cardiovascular status: blood pressure returned to baseline and stable Postop Assessment: no apparent nausea or vomiting Anesthetic complications: no   No notable events documented.   Last Vitals:  Vitals:   03/20/21 1602 03/20/21 1614  BP: 112/70 104/70  Pulse: 84 76  Resp: 16 18  Temp: (!) 36.2 C   SpO2: 100% 100%    Last Pain:  Vitals:   03/20/21 1602  TempSrc: Temporal  PainSc: 4                  Yevette Edwards

## 2021-03-26 NOTE — Telephone Encounter (Signed)
Laparoscopic Cholecystectomy 03/20/2021-she has not bowel movement-having some nausea. Denies fever. Feels fine otherwise- She stated she is having some bleeding from her rectum when wiping-possibly an inflamed hemorrhoid from the hard stool. She stated she took 2 gentle laxatives a few hours ago and taking Miralax twice daily. We discussed drinking magnesium citrate tomorrow if no relief after the gentle laxatives. If bleeding worsens call office or go to the emergency room.

## 2021-03-27 ENCOUNTER — Other Ambulatory Visit: Payer: Self-pay

## 2021-03-27 ENCOUNTER — Encounter: Payer: Self-pay | Admitting: Emergency Medicine

## 2021-03-27 ENCOUNTER — Emergency Department
Admission: EM | Admit: 2021-03-27 | Discharge: 2021-03-27 | Disposition: A | Discharge status: Home / Self Care | Payer: BC Managed Care – PPO | Attending: Emergency Medicine | Admitting: Emergency Medicine

## 2021-03-27 DIAGNOSIS — J45909 Unspecified asthma, uncomplicated: Secondary | ICD-10-CM | POA: Insufficient documentation

## 2021-03-27 DIAGNOSIS — Z79899 Other long term (current) drug therapy: Secondary | ICD-10-CM | POA: Diagnosis not present

## 2021-03-27 DIAGNOSIS — R110 Nausea: Secondary | ICD-10-CM | POA: Diagnosis present

## 2021-03-27 DIAGNOSIS — K5903 Drug induced constipation: Secondary | ICD-10-CM | POA: Diagnosis not present

## 2021-03-27 DIAGNOSIS — R1013 Epigastric pain: Secondary | ICD-10-CM | POA: Insufficient documentation

## 2021-03-27 LAB — LIPASE, BLOOD: Lipase: 40 U/L (ref 11–51)

## 2021-03-27 LAB — CBC WITH DIFFERENTIAL/PLATELET
Abs Immature Granulocytes: 0.02 10*3/uL (ref 0.00–0.07)
Basophils Absolute: 0 10*3/uL (ref 0.0–0.1)
Basophils Relative: 1 %
Eosinophils Absolute: 0.5 10*3/uL (ref 0.0–0.5)
Eosinophils Relative: 7 %
HCT: 37.8 % (ref 36.0–46.0)
Hemoglobin: 13 g/dL (ref 12.0–15.0)
Immature Granulocytes: 0 %
Lymphocytes Relative: 20 %
Lymphs Abs: 1.4 10*3/uL (ref 0.7–4.0)
MCH: 29.8 pg (ref 26.0–34.0)
MCHC: 34.4 g/dL (ref 30.0–36.0)
MCV: 86.7 fL (ref 80.0–100.0)
Monocytes Absolute: 0.5 10*3/uL (ref 0.1–1.0)
Monocytes Relative: 7 %
Neutro Abs: 4.5 10*3/uL (ref 1.7–7.7)
Neutrophils Relative %: 65 %
Platelets: 239 10*3/uL (ref 150–400)
RBC: 4.36 MIL/uL (ref 3.87–5.11)
RDW: 12.6 % (ref 11.5–15.5)
WBC: 6.9 10*3/uL (ref 4.0–10.5)
nRBC: 0 % (ref 0.0–0.2)

## 2021-03-27 LAB — COMPREHENSIVE METABOLIC PANEL
ALT: 40 U/L (ref 0–44)
AST: 23 U/L (ref 15–41)
Albumin: 3.8 g/dL (ref 3.5–5.0)
Alkaline Phosphatase: 74 U/L (ref 38–126)
Anion gap: 10 (ref 5–15)
BUN: 12 mg/dL (ref 6–20)
CO2: 28 mmol/L (ref 22–32)
Calcium: 9.5 mg/dL (ref 8.9–10.3)
Chloride: 101 mmol/L (ref 98–111)
Creatinine, Ser: 0.61 mg/dL (ref 0.44–1.00)
GFR, Estimated: >60 mL/min (ref >60)
Glucose, Bld: 88 mg/dL (ref 70–99)
Potassium: 4 mmol/L (ref 3.5–5.1)
Sodium: 139 mmol/L (ref 135–145)
Total Bilirubin: 1 mg/dL (ref 0.3–1.2)
Total Protein: 8.1 g/dL (ref 6.5–8.1)

## 2021-03-27 MED ORDER — FLEET ENEMA 7-19 GM/118ML RE ENEM
1.0000 | ENEMA | Freq: Once | RECTAL | Status: AC
  Administered 2021-03-27: 1 via RECTAL

## 2021-03-27 MED ORDER — LACTULOSE 10 GM/15ML PO SOLN
20.0000 g | Freq: Once | ORAL | Status: AC
  Administered 2021-03-27: 20 g via ORAL
  Filled 2021-03-27: qty 30

## 2021-03-27 MED ORDER — DOCUSATE SODIUM 100 MG PO CAPS: 100.0000 mg | ORAL_CAPSULE | Freq: Two times a day (BID) | ORAL | 0 refills | Status: AC

## 2021-03-27 NOTE — ED Notes (Signed)
Pt given several cups of water and educated on plan and reason for delay in enema.

## 2021-03-27 NOTE — ED Notes (Signed)
Had gallbladder surgery last week.  Says she canot have a bm since.  Says she has tried the dulcolax tabs, enema yesterday and mag citrate.

## 2021-03-27 NOTE — Discharge Instructions (Addendum)
Please start taking the MiraLAX twice daily, once in the morning and once at night.  Please also start taking the Colace.  Now that you are off the Percocet I suspect that your constipation will improve.  Please follow-up with your surgeon as scheduled.

## 2021-03-27 NOTE — ED Provider Notes (Signed)
[facility]  ____________________________________________   Event Date/Time   First MD Initiated Contact with Patient 03/27/21 1112     (approximate)  I have reviewed the triage vital signs and the nursing notes.   HISTORY  Chief Complaint Constipation    HPI Vanessa Booker is a 25 y.o. female presents with past medical history of recent cholecystectomy who presents with constipation.  Patient had her gallbladder removed 1 week ago.  She has not had a bowel movement since then.  She was taking Percocet but has not had it for the past 2 days.  She has been taking MiraLAX daily since the surgery.  Yesterday tried magnesium citrate and an enema but this did not work.  She has pain when she tries to push, also notes some bright red blood with wiping.  She has had nausea but no vomiting.  No fevers or chills.  Also started her menstrual period today and is having some cramping related to that.  She endorses feeling like there is a lump in her abdomen, no new significant pain.      Past Medical History:  Diagnosis Date   Asthma    Headaches due to old head injury    History of PFTs sept 2013   normal    Prediabetes    Reflux     Patient Active Problem List   Diagnosis Date Noted   Elevated LFTs 01/23/2021   RUQ abdominal pain 01/23/2021   Migraine 11/29/2020   Fracture of posterior malleolus 11/18/2019   Morbid obesity (HCC) 08/01/2016   Left arm pain 04/15/2016   Adjustment disorder with mixed anxiety and depressed mood 09/13/2013   Folliculitis 07/27/2013   Hidradenitis suppurativa 10/01/2012   Prediabetes 09/21/2012    Past Surgical History:  Procedure Laterality Date   CHOLECYSTECTOMY      Prior to Admission medications   Medication Sig Start Date End Date Taking? Authorizing Provider  albuterol (VENTOLIN HFA) 108 (90 Base) MCG/ACT inhaler Inhale 2 puffs into the lungs every 6 (six) hours as needed for wheezing or shortness of breath.    Yes [provider]  bismuth subsalicylate (PEPTO BISMOL) 262 MG chewable tablet Chew 524 mg by mouth as needed for indigestion or diarrhea or loose stools.   Yes [provider]  docusate sodium (COLACE) 100 MG capsule Take 1 capsule (100 mg total) by mouth 2 (two) times daily. 03/27/21 04/26/21 Yes Georga Hacking, MD  ibuprofen (ADVIL) 200 MG tablet Take 800 mg by mouth every 6 (six) hours as needed for moderate pain or headache.   Yes [provider]  ondansetron (ZOFRAN) 4 MG tablet Take 4 mg by mouth every 4 (four) hours as needed for nausea or vomiting.   Yes [provider]  pantoprazole (PROTONIX) 40 MG tablet Take 40 mg by mouth daily.   Yes [provider]  phentermine 37.5 MG capsule Take 37.5 mg by mouth daily.   Yes [provider]  oxyCODONE (OXY IR/ROXICODONE) 5 MG immediate release tablet Take 5 mg by mouth every 4 (four) hours as needed for severe pain.    [provider]  oxyCODONE-acetaminophen (PERCOCET) 5-325 MG tablet Take 1-2 tablets by mouth every 4 (four) hours as needed for severe pain. 03/20/21 03/20/22  Leafy Ro, MD    Allergies Celexa [citalopram hydrobromide], Metformin, Shrimp [shellfish allergy], Hydrocodone, Cefazolin, and Cefprozil  Family History  Problem Relation Age of Onset   Diabetes Mother    Hypertension  Mother    Depression Mother    Kidney disease Mother    Anxiety disorder Mother    Cancer Father        passed away from colon cancer   Cancer Maternal Aunt    Anxiety disorder Maternal Aunt    Depression Maternal Aunt    Diabetes Maternal Grandfather    Hypertension Maternal Grandfather    Anxiety disorder Maternal Grandfather    Depression Maternal Grandfather     Social History Social History   Tobacco Use   Smoking status: Never   Smokeless tobacco: Never  Substance Use Topics   Alcohol use: No    Alcohol/week: 0.0 standard drinks   Drug use: No    Review of  Systems   Review of Systems  Constitutional:  Negative for chills and fever.  Respiratory:  Negative for shortness of breath.   Gastrointestinal:  Positive for abdominal distention, abdominal pain, constipation and nausea. Negative for vomiting.  Genitourinary:  Negative for dysuria.  All other systems reviewed and are negative.  Physical Exam Updated Vital Signs BP 115/78   Pulse 89   Temp 98.6 F (37 C) (Oral)   Resp 17   Ht 5\' 5"  (1.651 m)   Wt 94.8 kg   LMP  (LMP Unknown)   SpO2 96%   BMI 34.78 kg/m   Physical Exam Vitals and nursing note reviewed.  Constitutional:      General: She is not in acute distress.    Appearance: Normal appearance.  HENT:     Head: Normocephalic and atraumatic.  Eyes:     General: No scleral icterus.    Conjunctiva/sclera: Conjunctivae normal.  Pulmonary:     Effort: Pulmonary effort is normal. No respiratory distress.     Breath sounds: No stridor.  Abdominal:     Palpations: Abdomen is soft.     Comments: Laparoscopic surgical incisions noted, abdomen is soft throughout, tenderness in the suprapubic region and epigastric region  Musculoskeletal:        General: No deformity or signs of injury.     Cervical back: Normal range of motion.  Skin:    General: Skin is dry.     Coloration: Skin is not jaundiced or pale.  Neurological:     General: No focal deficit present.     Mental Status: She is alert and oriented to person, place, and time. Mental status is at baseline.  Psychiatric:        Mood and Affect: Mood normal.        Behavior: Behavior normal.     LABS (all labs ordered are listed, but only abnormal results are displayed)  Labs Reviewed  COMPREHENSIVE METABOLIC PANEL  CBC WITH DIFFERENTIAL/PLATELET  LIPASE, BLOOD  URINALYSIS, COMPLETE (UACMP) WITH MICROSCOPIC  POC URINE PREG, ED   ____________________________________________  EKG  N/a ____________________________________________  RADIOLOGY , personally viewed and evaluated these images (plain radiographs) as part of my medical decision making, as well as reviewing the written report by the radiologist.  ED MD interpretation:  n/a    ____________________________________________   PROCEDURES  Procedure(s) performed (including Critical Care):  Procedures   ____________________________________________   INITIAL IMPRESSION / ASSESSMENT AND PLAN / ED COURSE     25 year old female presenting with constipation postoperatively.  No bowel movement in 1 week since gallbladder removed.  On exam she is well-appearing.  Abdomen is soft but does have tenderness primarily in the lower quadrants and epigastric region.  Labs  obtained in triage are within normal limits.  She has tried multiple agents at home with no relief but only consistent stool softener has been MiraLAX.   Suspicion for bowel obstruction at this time.  We will continue to monitor.  Patient given lactulose and enema in the ED and was able to pass a large stool ball, says that it "felt like she was having a baby." Much improved at this time.  Repeat abdominal exam is benign without tenderness.  I advised her to start doubling her MiraLAX and will prescribe Colace as well.  Discussed follow-up with Dr. Everlene Farrier as scheduled.     ____________________________________________   FINAL CLINICAL IMPRESSION(S) / ED DIAGNOSES  Final diagnoses:  Drug-induced constipation     ED Discharge Orders          Ordered    docusate sodium (COLACE) 100 MG capsule  2 times daily        03/27/21 1350             Note:  This document was prepared using Dragon voice recognition software and may include unintentional dictation errors.    Georga Hacking, MD 03/27/21 1352

## 2021-03-27 NOTE — ED Triage Notes (Signed)
Pt to ED via POV, pt states that she had gallbladder surgery on 03/20/21. Pt reports that she has been taking narcotic pain medication for the pain and now is unable to have a bowel movement. Pt states that she is unable to pass gas and is having pain in her abdomen and rectum. Pt denies chest pain or shortness of breath. Pt has been using OTC medications to have a bowel movement but they have not helped. Pt is in NAD.

## 2021-03-27 NOTE — ED Provider Notes (Signed)
Emergency Medicine Provider Triage Evaluation Note  Vanessa Booker , a 25 y.o. female  was evaluated in triage.  Pt complains of constipation.  Recent gallbladder surgery.  Taking narcotic pain medication.  Review of Systems  Positive: Constipation and abdominal pain Negative: Fever, chills, chest pain, shortness of breath, dysuria  Physical Exam  LMP  (LMP Unknown)  Gen:   Awake, no distress   Resp:  Normal effort  MSK:   Moves extremities without difficulty  Other:  Abdomen, bowel sounds present  Medical Decision Making  Medically screening exam initiated at 9:17 AM.  Appropriate orders placed.  Vanessa Booker was informed that the remainder of the evaluation will be completed by another provider, this initial triage assessment does not replace that evaluation, and the importance of remaining in the ED until their evaluation is complete.     Faythe Ghee, PA-C 03/27/21 0919    Chesley Noon, MD 03/27/21 1535

## 2021-03-27 NOTE — ED Notes (Signed)
Called pharmacy for fleet enema- pharmacy states is floorstock only. Calling supply chain for resupply.

## 2021-03-27 NOTE — Telephone Encounter (Signed)
Patient called and states that she has gone to the ER at [facility] and they told her to call us and let us know she was there. She said she took the Magnesium Citrate and did an enema and neither worked. I let her know that I would notify Dr Everlene Farrier but that he was in surgery right now.

## 2021-04-11 ENCOUNTER — Ambulatory Visit (INDEPENDENT_AMBULATORY_CARE_PROVIDER_SITE_OTHER): Payer: BC Managed Care – PPO | Admitting: Physician Assistant

## 2021-04-11 ENCOUNTER — Encounter: Payer: Self-pay | Admitting: Physician Assistant

## 2021-04-11 ENCOUNTER — Other Ambulatory Visit: Payer: Self-pay

## 2021-04-11 VITALS — BP 110/84 | HR 87 | Temp 98.0°F | Ht 65.0 in | Wt 218.0 lb

## 2021-04-11 DIAGNOSIS — R1011 Right upper quadrant pain: Secondary | ICD-10-CM

## 2021-04-11 DIAGNOSIS — Z09 Encounter for follow-up examination after completed treatment for conditions other than malignant neoplasm: Secondary | ICD-10-CM

## 2021-04-11 NOTE — Progress Notes (Signed)
[facility] POST-OP OFFICE VISIT  04/11/2021  HPI: Vanessa Booker is a 25 y.o. female 15 days s/p robotic assisted laparoscopic cholecystectomy for chronic cholecystitis with Dr Everlene Farrier  She reports that she is doing well Some soreness at her umbilicus but this is mild No fever, chills, nausea, emesis She did have constipation issues initially after surgery but this has resolved. Now with very sporadic diarrhea after heavy meals.  No issues with her incisions Otherwise doing well  Vital signs: BP 110/84   Pulse 87   Temp 98 F (36.7 C)   Ht 5\' 5"  (1.651 m)   Wt 218 lb (98.9 kg)   LMP  (LMP Unknown)   SpO2 98%   BMI 36.28 kg/m    Physical Exam: Constitutional: Well appearing female, NAD Abdomen: Soft, non-tender, non-distended, no rebound/guarding Skin: Laparoscopic incisions are well healed  Assessment/Plan: This is a 25 y.o. female 15 days s/p robotic assisted laparoscopic cholecystectomy for chronic cholecystitis    - Pain control prn  - Reviewed wound care  - Reviewed lifting restrictions; recommend 4 weeks, note given. She was instructed to cal if having problems at 4 weeks and we can extend her through 6 weeks  - Reviewed pathology: CCC  - She can follow up as needed   -- 22, PA-C [facility] 04/11/2021, 10:29 AM [phone] M-F: 7am - 4pm

## 2021-04-11 NOTE — Patient Instructions (Addendum)
Follow-up with our office as needed.  Please call and ask to speak with a nurse if you develop questions or concerns.   GENERAL POST-OPERATIVE PATIENT INSTRUCTIONS   WOUND CARE INSTRUCTIONS: Try to keep the wound dry and avoid ointments on the wound unless directed to do so.  If the wound becomes bright red and painful or starts to drain infected material that is not clear, please contact your physician immediately.  If the wound is mildly pink and has a thick firm ridge underneath it, this is normal, and is referred to as a healing ridge.  This will resolve over the next 4-6 weeks.  BATHING:  You may shower if you have been informed of this by your surgeon. However, Please do not submerge in a tub, hot tub, or pool until incisions are completely sealed or have been told by your surgeon that you may do so.  DIET:  You may eat any foods that you can tolerate.  It is a good idea to eat a high fiber diet and take in plenty of fluids to prevent constipation.  If you do become constipated you may want to take a mild laxative or take ducolax tablets on a daily basis until your bowel habits are regular.  Constipation can be very uncomfortable, along with straining, after recent surgery.  ACTIVITY:  You are encouraged to walk and engage in light activity for the next two weeks.  You should not lift more than 20 pound for 4-6 weeks after surgery as it could put you at increased risk for complications.  Twenty pounds is roughly equivalent to a plastic bag of groceries. At that time- Listen to your body when lifting, if you have pain when lifting, stop and then try again in a few days. Soreness after doing exercises or activities of daily living is normal as you get back in to your normal routine.  MEDICATIONS:  Try to take narcotic medications and anti-inflammatory medications, such as tylenol, ibuprofen, naprosyn, etc., with food.  This will minimize stomach upset from the medication.  Should you develop  nausea and vomiting from the pain medication, or develop a rash, please discontinue the medication and contact your physician.  You should not drive, make important decisions, or operate machinery when taking narcotic pain medication.  SUNBLOCK Use sun block to incision area over the next year if this area will be exposed to sun. This helps decrease scarring and will allow you avoid a permanent darkened area over your incision.  QUESTIONS:  Please feel free to call our office if you have any questions, and we will be glad to assist you.

## 2021-05-31 ENCOUNTER — Ambulatory Visit (INDEPENDENT_AMBULATORY_CARE_PROVIDER_SITE_OTHER): Payer: BC Managed Care – PPO | Admitting: Gastroenterology

## 2021-05-31 ENCOUNTER — Encounter (INDEPENDENT_AMBULATORY_CARE_PROVIDER_SITE_OTHER): Payer: Self-pay | Admitting: Gastroenterology

## 2021-05-31 ENCOUNTER — Encounter (INDEPENDENT_AMBULATORY_CARE_PROVIDER_SITE_OTHER): Payer: Self-pay | Admitting: *Deleted

## 2022-04-26 IMAGING — US US ABDOMEN LIMITED
1 series · 14 of 25 positions shown · non-contrast
Comparison: None.

CLINICAL DATA: Acute epigastric pain for 2 days

EXAM:
ULTRASOUND ABDOMEN LIMITED RIGHT UPPER QUADRANT

[Series 1: us abdomen limited ruq · 14 of 48 slices shown]
[im 1/48]
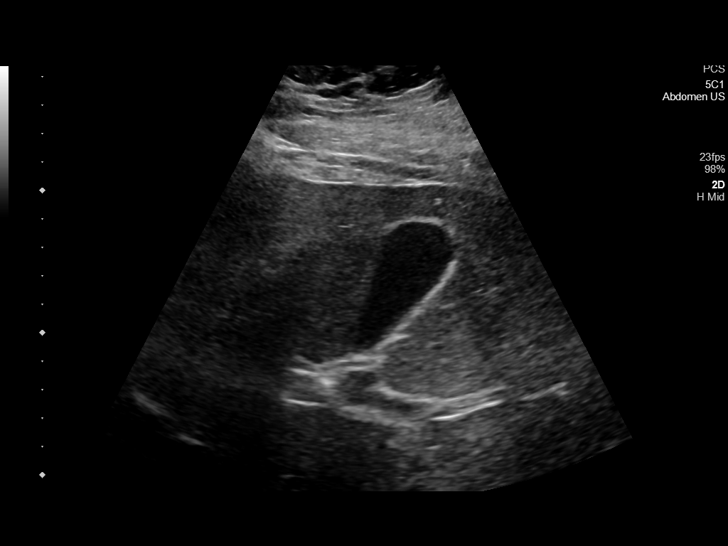
[im 4/48]
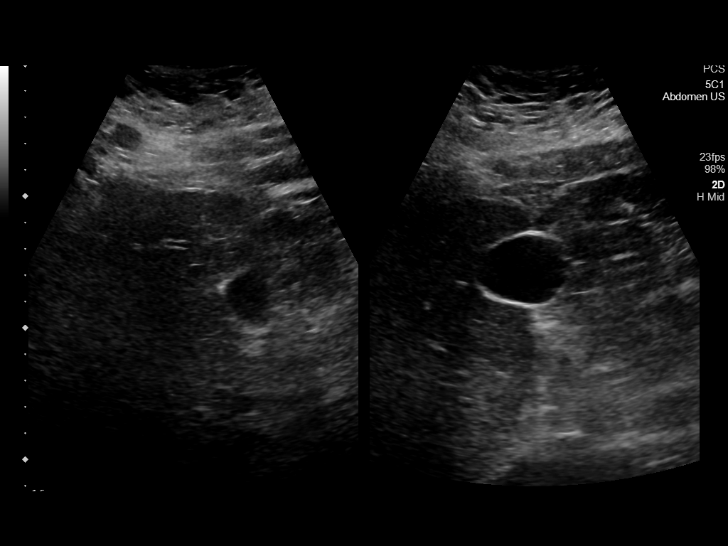
[im 8/48]
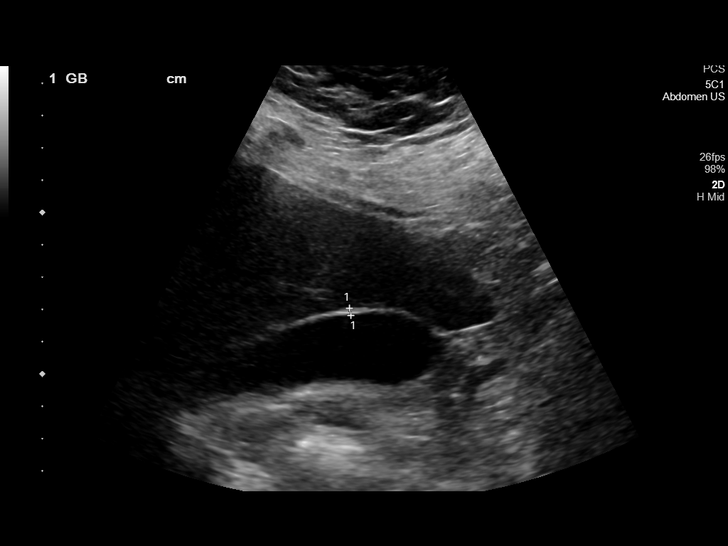
[im 12/48]
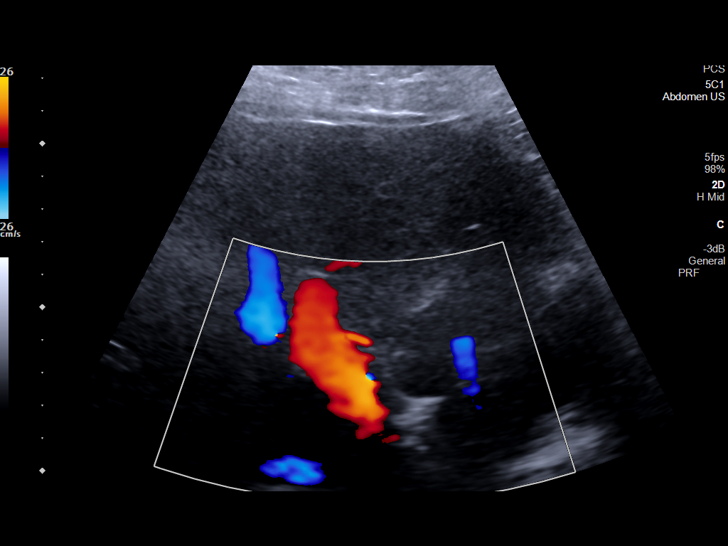
[im 16/48]
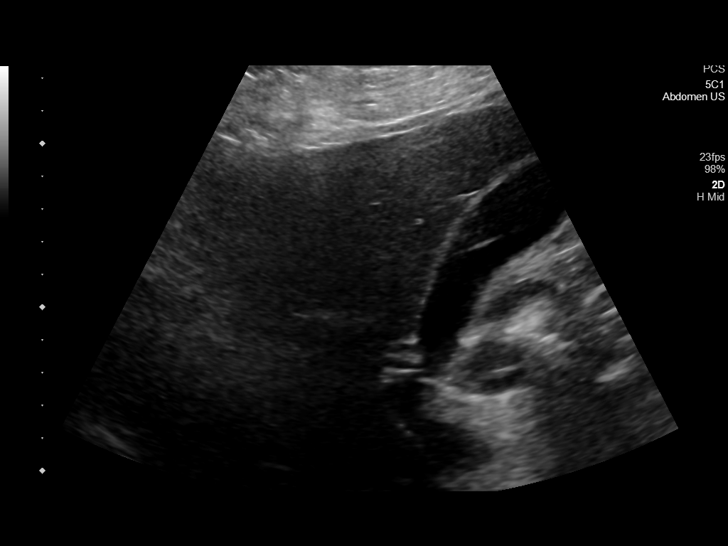
[im 18/48]
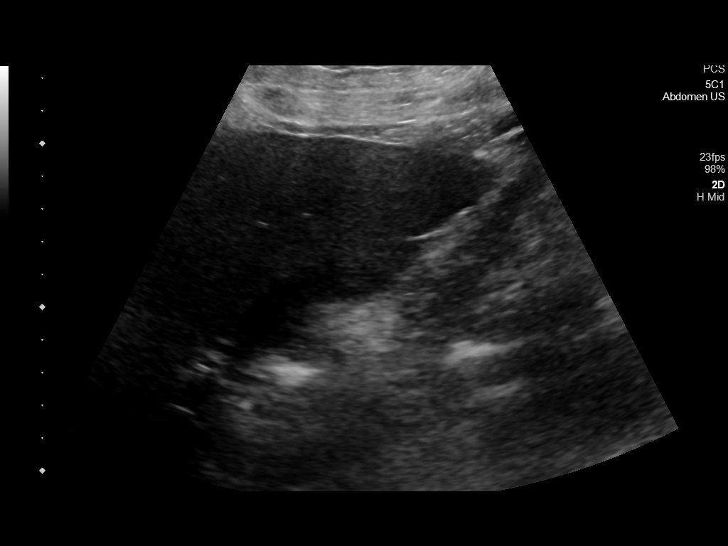
[im 22/48]
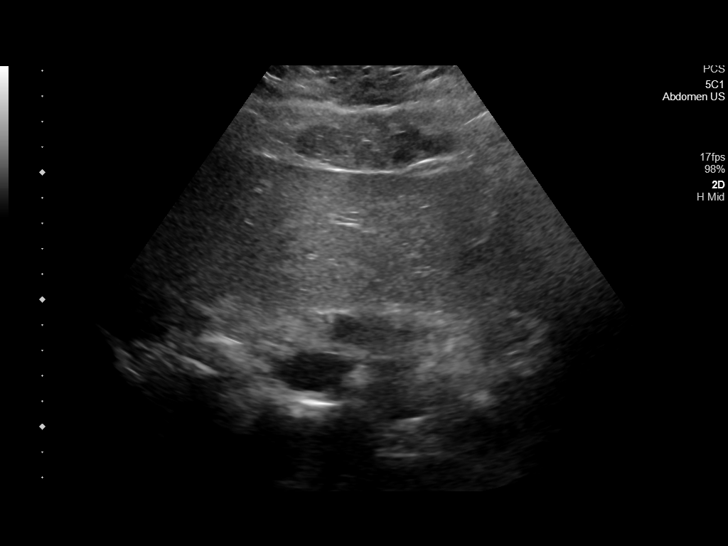
[im 26/48]
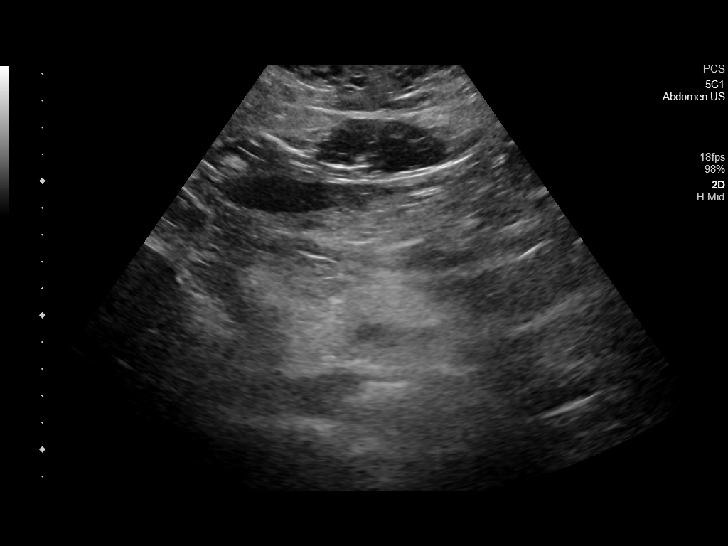
[im 30/48]
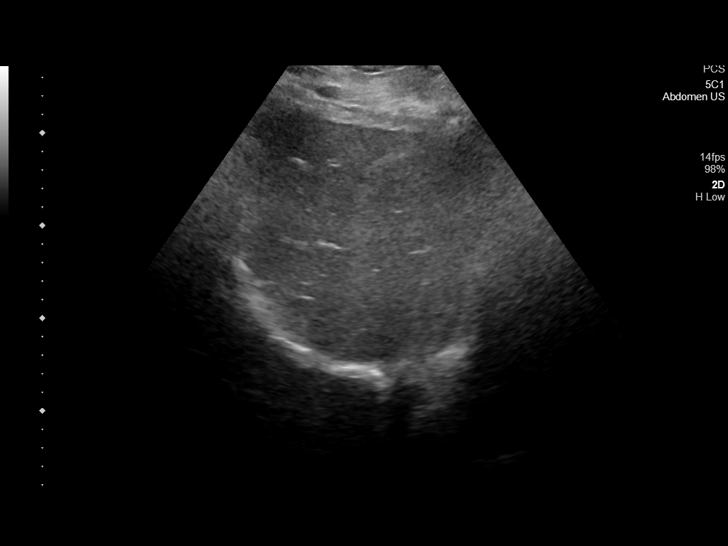
[im 32/48]
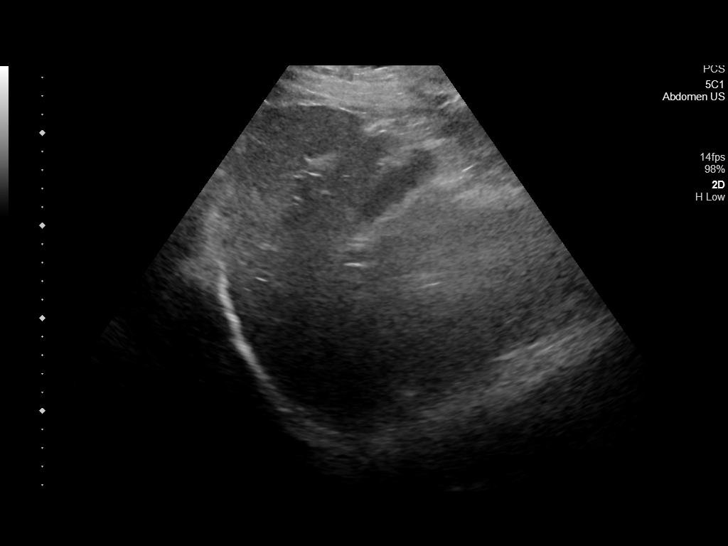
[im 36/48]
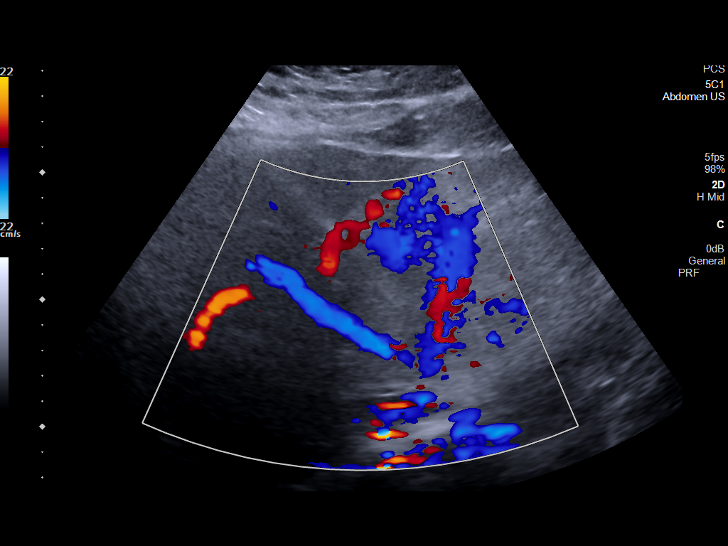
[im 40/48]
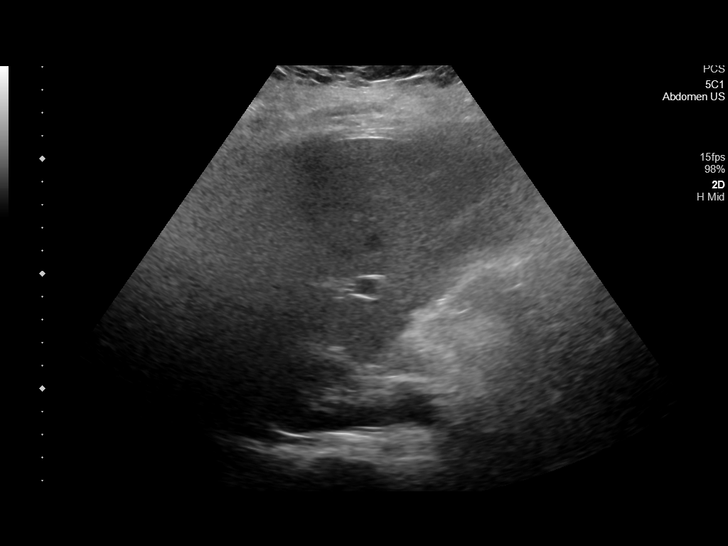
[im 44/48]
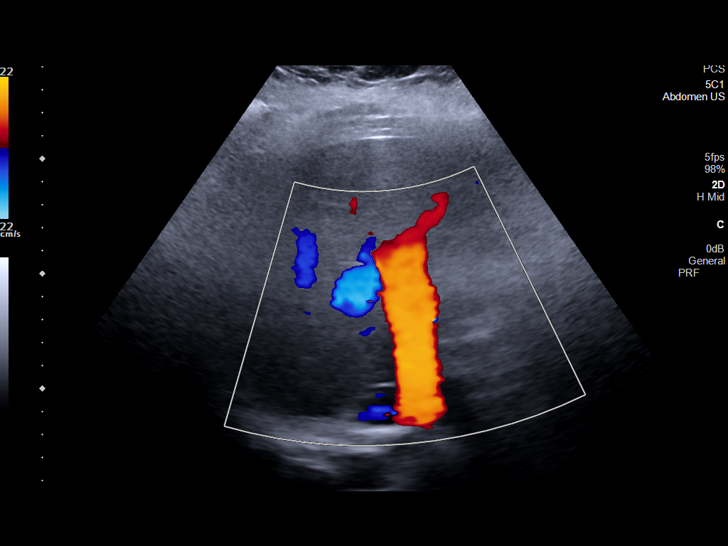
[im 48/48]
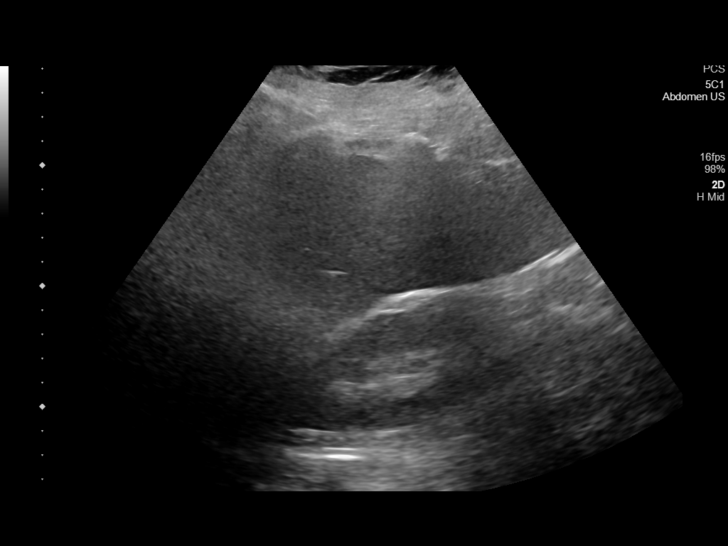

[14 of 25 positions shown; findings below may reference images not displayed]

FINDINGS: Gallbladder:

No gallstones or wall thickening visualized. No sonographic Murphy
sign noted by sonographer.

Common bile duct:

Diameter: 4.1 mm

Liver:

Increased echogenicity. No focal mass. Portal vein is patent on
color Doppler imaging with normal direction of blood flow towards
the liver.

Other: None.
IMPRESSION: 1. No acute abnormalities.
2. Increased echogenicity in the liver is nonspecific but often due
to hepatic steatosis. No focal mass.

## 2022-08-29 ENCOUNTER — Emergency Department
Admission: EM | Admit: 2022-08-29 | Discharge: 2022-08-29 | Disposition: A | Discharge status: Home / Self Care | Payer: Managed Care, Other (non HMO) | Attending: Emergency Medicine | Admitting: Emergency Medicine

## 2022-08-29 ENCOUNTER — Encounter: Payer: Self-pay | Admitting: Emergency Medicine

## 2022-08-29 DIAGNOSIS — J45909 Unspecified asthma, uncomplicated: Secondary | ICD-10-CM | POA: Insufficient documentation

## 2022-08-29 DIAGNOSIS — T783XXA Angioneurotic edema, initial encounter: Secondary | ICD-10-CM | POA: Diagnosis not present

## 2022-08-29 DIAGNOSIS — T7840XA Allergy, unspecified, initial encounter: Secondary | ICD-10-CM | POA: Insufficient documentation

## 2022-08-29 DIAGNOSIS — R220 Localized swelling, mass and lump, head: Secondary | ICD-10-CM | POA: Diagnosis present

## 2022-08-29 MED ORDER — DIPHENHYDRAMINE HCL 50 MG/ML IJ SOLN
25.0000 mg | Freq: Once | INTRAMUSCULAR | Status: AC
  Administered 2022-08-29: 25 mg via INTRAVENOUS
  Filled 2022-08-29: qty 1

## 2022-08-29 MED ORDER — FAMOTIDINE 20 MG PO TABS: 20.0000 mg | ORAL_TABLET | Freq: Two times a day (BID) | ORAL | 0 refills | Status: AC

## 2022-08-29 MED ORDER — PREDNISONE 20 MG PO TABS: ORAL_TABLET | ORAL | 0 refills | Status: AC

## 2022-08-29 MED ORDER — METHYLPREDNISOLONE SODIUM SUCC 125 MG IJ SOLR
125.0000 mg | Freq: Once | INTRAMUSCULAR | Status: AC
  Administered 2022-08-29: 125 mg via INTRAVENOUS
  Filled 2022-08-29: qty 2

## 2022-08-29 MED ORDER — FAMOTIDINE IN NACL 20-0.9 MG/50ML-% IV SOLN
20.0000 mg | Freq: Once | INTRAVENOUS | Status: AC
  Administered 2022-08-29: 20 mg via INTRAVENOUS
  Filled 2022-08-29: qty 50

## 2022-08-29 MED ORDER — SODIUM CHLORIDE 0.9 % IV BOLUS
1000.0000 mL | Freq: Once | INTRAVENOUS | Status: AC
  Administered 2022-08-29: 1000 mL via INTRAVENOUS

## 2022-08-29 MED ORDER — EPINEPHRINE 0.3 MG/0.3ML IJ SOAJ: 0.3000 mg | Freq: Once | INTRAMUSCULAR | 2 refills | Status: AC

## 2022-08-29 MED ORDER — EPINEPHRINE 0.3 MG/0.3ML IJ SOAJ
0.3000 mg | Freq: Once | INTRAMUSCULAR | Status: AC
  Administered 2022-08-29: 0.3 mg via INTRAMUSCULAR
  Filled 2022-08-29: qty 0.3

## 2022-08-29 NOTE — ED Provider Notes (Signed)
-----------------------------------------   9:27 AM on 08/29/2022 -----------------------------------------  I took over care of this patient from Dr. Beather Arbour.  On reassessment the patient states that her symptoms are continue to improve.  Her voice is more clear.  Oropharynx is clear with no visible swelling.  At this time, the patient is stable for discharge home.  Return precautions and prescriptions have been provided.   Arta Silence, MD 08/29/22 [phone]

## 2022-08-29 NOTE — ED Triage Notes (Signed)
Pt presents ambulatory to triage via POV with complaints of oral swelling - potential allergic reaction that occurred last night. Pt endorses tongue and facial swelling. Airway patent - able to swallow saliva. Respirations equal and unlabored. Known allergen to shellfish - tried new chapstick yesterdays. A&Ox4 at this time. Denies CP or SOB.

## 2022-08-29 NOTE — ED Notes (Signed)
Pt ambulatory to restroom

## 2022-08-29 NOTE — ED Provider Notes (Signed)
[facility] Provider Note    Event Date/Time   First MD Initiated Contact with Patient 08/29/22 [phone]     (approximate)   History   Oral Swelling   HPI  Vanessa Booker is a 27 y.o. female who presents to the ED from home with a chief complaint of oral swelling.  Patient noted some itchiness to the back of the throat prior to bedtime.  Awoke with tongue and facial swelling.  Only new exposure is patient used a new Chapstick yesterday afternoon.  Denies new foods, medications including over-the-counter's or environmental exposures.  Did not use her EpiPen as it was expired.  Denies chest pain, shortness of breath, nausea, vomiting or diarrhea.     Past Medical History   Past Medical History:  Diagnosis Date   Asthma    Headaches due to old head injury    History of PFTs sept 2013   normal    Prediabetes    Reflux      Active Problem List   Patient Active Problem List   Diagnosis Date Noted   Elevated LFTs 01/23/2021   RUQ abdominal pain 01/23/2021   Migraine 11/29/2020   Fracture of posterior malleolus 11/18/2019   Morbid obesity ([place]) 08/01/2016   Left arm pain 04/15/2016   Adjustment disorder with mixed anxiety and depressed mood [id number]   Folliculitis [id number]   Hidradenitis suppurativa 10/01/2012   Prediabetes 09/21/2012     Past Surgical History   Past Surgical History:  Procedure Laterality Date   CHOLECYSTECTOMY       Home Medications   Prior to Admission medications   Medication Sig Start Date End Date Taking? Authorizing Provider  EPINEPHrine 0.3 mg/0.3 mL IJ SOAJ injection Inject 0.3 mg into the muscle once for 1 dose. 08/29/22 08/29/22 Yes Paulette Blanch, MD  famotidine (PEPCID) 20 MG tablet Take 1 tablet (20 mg total) by mouth 2 (two) times daily. 08/29/22  Yes Paulette Blanch, MD  predniSONE (DELTASONE) 20 MG tablet 3 tablets daily x 4 days 08/29/22  Yes Paulette Blanch, MD  albuterol (VENTOLIN HFA) 108 (90 Base) MCG/ACT  inhaler Inhale 2 puffs into the lungs every 6 (six) hours as needed for wheezing or shortness of breath.    [provider]  ibuprofen (ADVIL) 200 MG tablet Take 800 mg by mouth every 6 (six) hours as needed for moderate pain or headache.    [provider]  ondansetron (ZOFRAN) 4 MG tablet Take 4 mg by mouth every 4 (four) hours as needed for nausea or vomiting.    [provider]  pantoprazole (PROTONIX) 40 MG tablet Take 40 mg by mouth daily.    [provider]  phentermine 37.5 MG capsule Take 37.5 mg by mouth daily.    [provider]     Allergies  Celexa [citalopram hydrobromide], Metformin, Shrimp [shellfish allergy], Hydrocodone, Cefazolin, and Cefprozil   Family History   Family History  Problem Relation Age of Onset   Diabetes Mother    Hypertension Mother    Depression Mother    Kidney disease Mother    Anxiety disorder Mother    Cancer Father        passed away from colon cancer   Cancer Maternal Aunt    Anxiety disorder Maternal Aunt    Depression Maternal Aunt    Diabetes Maternal Grandfather    Hypertension Maternal Grandfather    Anxiety disorder Maternal Grandfather    Depression Maternal  Grandfather      Physical Exam  Triage Vital Signs: ED Triage Vitals  Enc Vitals Group     BP 08/29/22 0510 125/72     Pulse Rate 08/29/22 0510 90     Resp 08/29/22 0510 (!) 22     Temp 08/29/22 0510 98 F (36.7 C)     Temp Source 08/29/22 0510 Oral     SpO2 08/29/22 0510 100 %     Weight 08/29/22 0506 258 lb (117 kg)     Height 08/29/22 0506 '5\' 5"'$  (1.651 m)     Head Circumference --      Peak Flow --      Pain Score 08/29/22 0506 0     Pain Loc --      Pain Edu? --      Excl. in [place]? --     Updated Vital Signs: BP 125/72 (BP Location: Left Arm)   Pulse 90   Temp 98 F (36.7 C) (Oral)   Resp (!) 22   Ht '5\' 5"'$  (1.651 m)   Wt 117 kg   LMP 07/30/2022 (Approximate)   SpO2 100%   BMI 42.93 kg/m     General: Awake, no distress.  CV:  RRR.  Good peripheral perfusion.  Resp:  Normal effort.  CTAB. Abd:  No distention.  Other:  Mildly muffled speech.  Slight tongue swelling.  Slight swelling of both lips.  Posterior oropharynx clear.  Tolerating secretions well.  No neck swelling.   ED Results / Procedures / Treatments  Labs (all labs ordered are listed, but only abnormal results are displayed) Labs Reviewed - No data to display   EKG  None   RADIOLOGY None   Official radiology report(s): No results found.   PROCEDURES:  Critical Care performed: No  .1-3 Lead EKG Interpretation  Performed by: Paulette Blanch, MD Authorized by: Paulette Blanch, MD     Interpretation: normal     ECG rate:  90   ECG rate assessment: normal     Rhythm: sinus rhythm     Ectopy: none     Conduction: normal   Comments:     Patient placed on cardiac monitor to evaluate for arrhythmias    MEDICATIONS ORDERED IN ED: Medications  famotidine (PEPCID) IVPB 20 mg premix (20 mg Intravenous New Bag/Given 08/29/22 [id number])  sodium chloride 0.9 % bolus 1,000 mL (1,000 mLs Intravenous New Bag/Given 08/29/22 0610)  EPINEPHrine (EPI-PEN) injection 0.3 mg (0.3 mg Intramuscular Given 08/29/22 0636)  methylPREDNISolone sodium succinate (SOLU-MEDROL) 125 mg/2 mL injection 125 mg (125 mg Intravenous Given 08/29/22 0631)  diphenhydrAMINE (BENADRYL) injection 25 mg (25 mg Intravenous Given 08/29/22 0630)     IMPRESSION / MDM / ASSESSMENT AND PLAN / ED COURSE  I reviewed the triage vital signs and the nursing notes.                             27 year old female presenting with angioedema.  Will administer IV allergic reaction cocktail to include EpiPen, Solu-Medrol, Benadryl, Pepcid and fluids.  Will continue to monitor and reassess.  Patient's presentation is most consistent with acute presentation with potential threat to life or bodily function.  0655 Voice already sounds more clear.  Plan to monitor  for 3 hours after administration of medications which will be approximately 930.  Care will be transferred to the oncoming provider at change of shift.  FINAL CLINICAL IMPRESSION(S) / ED  DIAGNOSES   Final diagnoses:  Angioedema, initial encounter  Allergic reaction, initial encounter     Rx / DC Orders   ED Discharge Orders          Ordered    predniSONE (DELTASONE) 20 MG tablet        08/29/22 0526    famotidine (PEPCID) 20 MG tablet  2 times daily        08/29/22 0526    EPINEPHrine 0.3 mg/0.3 mL IJ SOAJ injection   Once        08/29/22 [id number]             Note:  This document was prepared using Dragon voice recognition software and may include unintentional dictation errors.   Paulette Blanch, MD 08/29/22 [phone]

## 2022-08-29 NOTE — Discharge Instructions (Signed)
1. Take the following medicines for the next 4 days: Prednisone '60mg'$  daily Pepcid '20mg'$  twice daily 2. Take Benadryl as needed for itching. 3. Use Epi-Pen in case of acute, life-threatening allergic reaction. 4. Return to the ER for worsening symptoms, persistent vomiting, difficulty breathing or other concerns.

## 2022-08-29 NOTE — ED Notes (Signed)
Sig pad not working. Pt verbalizes understanding of d/c instructions, denies questions or concerns. NAD noted. Steady gait.

## 2023-05-21 ENCOUNTER — Other Ambulatory Visit: Payer: Self-pay

## 2023-05-21 ENCOUNTER — Emergency Department: Payer: Managed Care, Other (non HMO)

## 2023-05-21 ENCOUNTER — Emergency Department
Admission: EM | Admit: 2023-05-21 | Discharge: 2023-05-21 | Disposition: A | Discharge status: Home / Self Care | Payer: Managed Care, Other (non HMO) | Attending: Emergency Medicine | Admitting: Emergency Medicine

## 2023-05-21 ENCOUNTER — Encounter: Payer: Self-pay | Admitting: Emergency Medicine

## 2023-05-21 DIAGNOSIS — J45909 Unspecified asthma, uncomplicated: Secondary | ICD-10-CM | POA: Diagnosis not present

## 2023-05-21 DIAGNOSIS — Y92410 Unspecified street and highway as the place of occurrence of the external cause: Secondary | ICD-10-CM | POA: Insufficient documentation

## 2023-05-21 DIAGNOSIS — M25512 Pain in left shoulder: Secondary | ICD-10-CM | POA: Diagnosis present

## 2023-05-21 DIAGNOSIS — S46912A Strain of unspecified muscle, fascia and tendon at shoulder and upper arm level, left arm, initial encounter: Secondary | ICD-10-CM

## 2023-05-21 MED ORDER — IBUPROFEN 600 MG PO TABS
600.0000 mg | ORAL_TABLET | Freq: Once | ORAL | Status: AC
  Administered 2023-05-21: 600 mg via ORAL
  Filled 2023-05-21: qty 1

## 2023-05-21 MED ORDER — BACLOFEN 10 MG PO TABS: 10.0000 mg | ORAL_TABLET | Freq: Three times a day (TID) | ORAL | 0 refills | Status: AC

## 2023-05-21 MED ORDER — MELOXICAM 15 MG PO TABS: 15.0000 mg | ORAL_TABLET | Freq: Every day | ORAL | 0 refills | Status: AC
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
## 2024-01-23 ENCOUNTER — Ambulatory Visit
